# Patient Record
Sex: Female | Born: 1975 | Race: Black or African American | Hispanic: No | Marital: Married | Smoking: Never smoker
Health system: Southern US, Community
[De-identification: ages and names within clinical notes are randomized; demographics above are authoritative.]

## PROBLEM LIST (undated history)

## (undated) DIAGNOSIS — E669 Obesity, unspecified: Secondary | ICD-10-CM

## (undated) DIAGNOSIS — I10 Essential (primary) hypertension: Secondary | ICD-10-CM

## (undated) DIAGNOSIS — D219 Benign neoplasm of connective and other soft tissue, unspecified: Secondary | ICD-10-CM

## (undated) HISTORY — PX: OOPHORECTOMY: SHX86

## (undated) HISTORY — PX: MYOMECTOMY: SHX85

---

## 2008-02-22 HISTORY — PX: GASTRIC BYPASS: SHX52

## 2010-02-25 ENCOUNTER — Emergency Department (HOSPITAL_COMMUNITY)
Admission: EM | Admit: 2010-02-25 | Discharge: 2010-02-26 | Payer: Self-pay | Source: Home / Self Care | Admitting: Emergency Medicine

## 2010-02-25 ENCOUNTER — Emergency Department (HOSPITAL_COMMUNITY)
Admission: EM | Admit: 2010-02-25 | Discharge: 2010-02-25 | Payer: Self-pay | Source: Home / Self Care | Admitting: Family Medicine

## 2010-02-25 LAB — CBC
HCT: 33.9 % — ABNORMAL LOW (ref 36.0–46.0)
Hemoglobin: 10.4 g/dL — ABNORMAL LOW (ref 12.0–15.0)
MCH: 25.3 pg — ABNORMAL LOW (ref 26.0–34.0)
MCHC: 30.7 g/dL (ref 30.0–36.0)
MCV: 82.5 fL (ref 78.0–100.0)
Platelets: 214 10*3/uL (ref 150–400)
RBC: 4.11 MIL/uL (ref 3.87–5.11)
RDW: 14 % (ref 11.5–15.5)
WBC: 3.9 10*3/uL — ABNORMAL LOW (ref 4.0–10.5)

## 2010-02-25 LAB — BASIC METABOLIC PANEL
BUN: 7 mg/dL (ref 6–23)
CO2: 26 mEq/L (ref 19–32)
Calcium: 8.7 mg/dL (ref 8.4–10.5)
Chloride: 101 mEq/L (ref 96–112)
Creatinine, Ser: 1.09 mg/dL (ref 0.4–1.2)
GFR calc Af Amer: >60 mL/min (ref >60)
GFR calc non Af Amer: 57 mL/min — ABNORMAL LOW (ref >60)
Glucose, Bld: 109 mg/dL — ABNORMAL HIGH (ref 70–99)
Potassium: 3.8 mEq/L (ref 3.5–5.1)
Sodium: 135 mEq/L (ref 135–145)

## 2010-02-25 LAB — DIFFERENTIAL
Basophils Absolute: 0 10*3/uL (ref 0.0–0.1)
Basophils Relative: 0 % (ref 0–1)
Eosinophils Absolute: 0 10*3/uL (ref 0.0–0.7)
Eosinophils Relative: 0 % (ref 0–5)
Lymphocytes Relative: 17 % (ref 12–46)
Lymphs Abs: 0.7 10*3/uL (ref 0.7–4.0)
Monocytes Absolute: 0.2 10*3/uL (ref 0.1–1.0)
Monocytes Relative: 4 % (ref 3–12)
Neutro Abs: 3.1 10*3/uL (ref 1.7–7.7)
Neutrophils Relative %: 78 % — ABNORMAL HIGH (ref 43–77)

## 2010-02-25 LAB — POCT RAPID STREP A (OFFICE): Streptococcus, Group A Screen (Direct): NEGATIVE

## 2010-02-26 LAB — URINALYSIS, ROUTINE W REFLEX MICROSCOPIC
Bilirubin Urine: NEGATIVE
Ketones, ur: 15 mg/dL — AB
Leukocytes, UA: NEGATIVE
Nitrite: NEGATIVE
Protein, ur: NEGATIVE mg/dL
Specific Gravity, Urine: 1.02 (ref 1.005–1.030)
Urine Glucose, Fasting: NEGATIVE mg/dL
Urobilinogen, UA: 0.2 mg/dL (ref 0.0–1.0)
pH: 6 (ref 5.0–8.0)

## 2010-02-26 LAB — URINE MICROSCOPIC-ADD ON

## 2011-08-12 ENCOUNTER — Ambulatory Visit (INDEPENDENT_AMBULATORY_CARE_PROVIDER_SITE_OTHER): Payer: 59 | Admitting: Family Medicine

## 2011-08-12 VITALS — BP 149/94 | HR 78 | Temp 98.4°F | Resp 16 | Ht 64.75 in | Wt 236.0 lb

## 2011-08-12 DIAGNOSIS — L259 Unspecified contact dermatitis, unspecified cause: Secondary | ICD-10-CM

## 2011-08-12 DIAGNOSIS — L237 Allergic contact dermatitis due to plants, except food: Secondary | ICD-10-CM

## 2011-08-12 DIAGNOSIS — L255 Unspecified contact dermatitis due to plants, except food: Secondary | ICD-10-CM

## 2011-08-12 MED ORDER — PREDNISONE 20 MG PO TABS: ORAL_TABLET | ORAL | Status: DC

## 2011-08-12 NOTE — Progress Notes (Signed)
      Patient Name: Heather Henson Date of Birth: Mar 18, 1975 Medical Record Number: [id number] Gender: female Date of Encounter: 08/12/2011  History of Present Illness:  Heather Henson is a 36 y.o. very pleasant female patient who presents with the following:  She noted onset of a blistery rash on her hands yesterday- it then spread to her chest and arms.  There is a little bit on her trunk as well.  She has not been working outdoors, no new foods or products.  She has not had this in the past.  She is usually healthy. No other symptoms that she has noted- no fever, chills, ST, etc.   She has tried benadryl but it has not helped LMP was 2 weeks ago The rash is itchy but also tender/ painful  There is no problem list on file for this patient.  No past medical history on file. No past surgical history on file. History  Substance Use Topics  . Smoking status: Never Smoker   . Smokeless tobacco: Not on file  . Alcohol Use: Not on file   No family history on file. Allergies  Allergen Reactions  . Morphine And Related   . Tetracyclines & Related     Medication list has been reviewed and updated.  Prior to Admission medications   Medication Sig Start Date End Date Taking? Authorizing Provider  Multiple Vitamins-Minerals (MULTIVITAMIN WITH MINERALS) tablet Take 1 tablet by mouth daily.   Yes Historical Provider, MD    Review of Systems:  As per HPI- otherwise negative.   Physical Examination: Filed Vitals:   08/12/11 0858  BP: 149/94  Pulse: 78  Temp: 98.4 F (36.9 C)  Resp: 16   Filed Vitals:   08/12/11 0858  Height: 5' 4.75" (1.645 m)  Weight: 236 lb (107.049 kg)   Body mass index is 39.58 kg/(m^2). Ideal Body Weight: Weight in (lb) to have BMI = 25: 148.8   GEN: WDWN, NAD, Non-toxic, A & O x 3, obese HEENT: Atraumatic, Normocephalic. Neck supple. No masses, No LAD.  PEERL, no oral lesions, oropharynx wnl Ears and Nose: No external deformity. CV: RRR, No  M/G/R. No JVD. No thrill. No extra heart sounds. PULM: CTA B, no wheezes, crackles, rhonchi. No retractions. No resp. distress. No accessory muscle use. EXTR: No c/c/e NEURO Normal gait.  PSYCH: Normally interactive. Conversant. Not depressed or anxious appearing.  Calm demeanor.  There is a clustered vesicular rash typical of rhus dermatitis on her fingers (both hands) and some on her neck and left abdomen.   Assessment and Plan: 1. Contact dermatitis  predniSONE (DELTASONE) 20 MG tablet  2. Poison ivy  predniSONE (DELTASONE) 20 MG tablet  contact/ rhus dermatitis per appearance, but pain with rash is less typical.  Close follow- up if not better with treatment.  Patient (or parent if minor) instructed to return to clinic or call if not better in 2 day(s).   Abbe Amsterdam, MD

## 2011-09-22 ENCOUNTER — Other Ambulatory Visit (HOSPITAL_COMMUNITY): Payer: Self-pay | Admitting: Obstetrics and Gynecology

## 2011-09-22 DIAGNOSIS — N979 Female infertility, unspecified: Secondary | ICD-10-CM

## 2011-09-27 ENCOUNTER — Ambulatory Visit (HOSPITAL_COMMUNITY)
Admission: RE | Admit: 2011-09-27 | Discharge: 2011-09-27 | Disposition: A | Discharge status: Home / Self Care | Payer: 59 | Source: Ambulatory Visit | Attending: Obstetrics and Gynecology | Admitting: Obstetrics and Gynecology

## 2011-09-27 DIAGNOSIS — N979 Female infertility, unspecified: Secondary | ICD-10-CM | POA: Insufficient documentation

## 2011-09-27 MED ORDER — IOHEXOL 300 MG/ML  SOLN: 6.0000 mL | Freq: Once | INTRAMUSCULAR | Status: AC | PRN

## 2011-10-09 ENCOUNTER — Encounter (HOSPITAL_COMMUNITY): Payer: Self-pay | Admitting: *Deleted

## 2011-10-09 ENCOUNTER — Emergency Department (HOSPITAL_COMMUNITY): Payer: 59

## 2011-10-09 ENCOUNTER — Emergency Department (HOSPITAL_COMMUNITY)
Admission: EM | Admit: 2011-10-09 | Discharge: 2011-10-09 | Disposition: A | Discharge status: Home / Self Care | Payer: 59 | Attending: Emergency Medicine | Admitting: Emergency Medicine

## 2011-10-09 DIAGNOSIS — K2970 Gastritis, unspecified, without bleeding: Secondary | ICD-10-CM

## 2011-10-09 HISTORY — DX: Obesity, unspecified: E66.9

## 2011-10-09 LAB — COMPREHENSIVE METABOLIC PANEL
ALT: 16 U/L (ref 0–35)
AST: 29 U/L (ref 0–37)
Albumin: 3.5 g/dL (ref 3.5–5.2)
Alkaline Phosphatase: 63 U/L (ref 39–117)
BUN: 14 mg/dL (ref 6–23)
CO2: 23 mEq/L (ref 19–32)
Calcium: 8.7 mg/dL (ref 8.4–10.5)
Chloride: 105 mEq/L (ref 96–112)
Creatinine, Ser: 0.69 mg/dL (ref 0.50–1.10)
GFR calc Af Amer: >90 mL/min (ref >90)
GFR calc non Af Amer: >90 mL/min (ref >90)
Glucose, Bld: 129 mg/dL — ABNORMAL HIGH (ref 70–99)
Potassium: 4 mEq/L (ref 3.5–5.1)
Sodium: 137 mEq/L (ref 135–145)
Total Bilirubin: 0.2 mg/dL — ABNORMAL LOW (ref 0.3–1.2)
Total Protein: 6.9 g/dL (ref 6.0–8.3)

## 2011-10-09 LAB — LIPASE, BLOOD: Lipase: 20 U/L (ref 11–59)

## 2011-10-09 LAB — CBC WITH DIFFERENTIAL/PLATELET
Basophils Absolute: 0 10*3/uL (ref 0.0–0.1)
Basophils Relative: 0 % (ref 0–1)
Eosinophils Absolute: 0 10*3/uL (ref 0.0–0.7)
Eosinophils Relative: 0 % (ref 0–5)
HCT: 36.6 % (ref 36.0–46.0)
Hemoglobin: 11.1 g/dL — ABNORMAL LOW (ref 12.0–15.0)
Lymphocytes Relative: 12 % (ref 12–46)
Lymphs Abs: 1 10*3/uL (ref 0.7–4.0)
MCH: 24.2 pg — ABNORMAL LOW (ref 26.0–34.0)
MCHC: 30.3 g/dL (ref 30.0–36.0)
MCV: 79.9 fL (ref 78.0–100.0)
Monocytes Absolute: 0.3 10*3/uL (ref 0.1–1.0)
Monocytes Relative: 4 % (ref 3–12)
Neutro Abs: 6.4 10*3/uL (ref 1.7–7.7)
Neutrophils Relative %: 83 % — ABNORMAL HIGH (ref 43–77)
Platelets: 271 10*3/uL (ref 150–400)
RBC: 4.58 MIL/uL (ref 3.87–5.11)
RDW: 15 % (ref 11.5–15.5)
WBC: 7.7 10*3/uL (ref 4.0–10.5)

## 2011-10-09 MED ORDER — FAMOTIDINE 20 MG PO TABS
40.0000 mg | ORAL_TABLET | Freq: Once | ORAL | Status: AC
  Administered 2011-10-09: 40 mg via ORAL
  Filled 2011-10-09: qty 2

## 2011-10-09 MED ORDER — PANTOPRAZOLE SODIUM 20 MG PO TBEC: 20.0000 mg | DELAYED_RELEASE_TABLET | Freq: Every day | ORAL | Status: DC

## 2011-10-09 MED ORDER — HYDROMORPHONE HCL PF 1 MG/ML IJ SOLN
1.0000 mg | Freq: Once | INTRAMUSCULAR | Status: AC
  Administered 2011-10-09: 1 mg via INTRAVENOUS
  Filled 2011-10-09: qty 1

## 2011-10-09 MED ORDER — HYDROMORPHONE HCL PF 1 MG/ML IJ SOLN: 1.0000 mg | Freq: Once | INTRAMUSCULAR | Status: DC

## 2011-10-09 MED ORDER — LORAZEPAM 2 MG/ML IJ SOLN
1.0000 mg | Freq: Once | INTRAMUSCULAR | Status: AC
  Administered 2011-10-09: 1 mg via INTRAVENOUS
  Filled 2011-10-09: qty 1

## 2011-10-09 MED ORDER — SUCRALFATE 1 G PO TABS: 1.0000 g | ORAL_TABLET | Freq: Four times a day (QID) | ORAL | Status: DC

## 2011-10-09 MED ORDER — LORAZEPAM 2 MG/ML IJ SOLN: 1.0000 mg | Freq: Once | INTRAMUSCULAR | Status: DC

## 2011-10-09 MED ORDER — SODIUM CHLORIDE 0.9 % IV BOLUS (SEPSIS)
1000.0000 mL | Freq: Once | INTRAVENOUS | Status: AC
  Administered 2011-10-09: 1000 mL via INTRAVENOUS

## 2011-10-09 MED ORDER — SODIUM CHLORIDE 0.9 % IV SOLN
INTRAVENOUS | Status: DC
  Administered 2011-10-09: 11:00:00 via INTRAVENOUS

## 2011-10-09 MED ORDER — GI COCKTAIL ~~LOC~~
30.0000 mL | Freq: Once | ORAL | Status: AC
  Administered 2011-10-09: 30 mL via ORAL
  Filled 2011-10-09: qty 30

## 2011-10-09 MED ORDER — ONDANSETRON HCL 4 MG/2ML IJ SOLN
4.0000 mg | Freq: Once | INTRAMUSCULAR | Status: AC
  Administered 2011-10-09: 4 mg via INTRAVENOUS
  Filled 2011-10-09: qty 2

## 2011-10-09 MED ORDER — DIPHENHYDRAMINE HCL 50 MG/ML IJ SOLN
25.0000 mg | Freq: Once | INTRAMUSCULAR | Status: AC
  Administered 2011-10-09: 25 mg via INTRAVENOUS
  Filled 2011-10-09: qty 1

## 2011-10-09 NOTE — ED Notes (Signed)
NAD noted at time of d/c home 

## 2011-10-09 NOTE — ED Provider Notes (Signed)
History     CSN: [id number]  Arrival date & time 10/09/11  0808   First MD Initiated Contact with Patient 10/09/11 [id number]      No chief complaint on file.   (Consider location/radiation/quality/duration/timing/severity/associated sxs/prior treatment) The history is provided by the patient.   patient here with abdominal discomfort after drinking approximately 2 ounces of hydrogen peroxide. She has had vomiting and diarrhea since then. Abdominal pain is cramping and diffuse in nature. Nothing makes it better or worse. States that she drank the liquid by mistake. Denies any fever. No other complaints of  No past medical history on file.  No past surgical history on file.  No family history on file.  History  Substance Use Topics  . Smoking status: Never Smoker   . Smokeless tobacco: Not on file  . Alcohol Use: Not on file    OB History    Grav Para Term Preterm Abortions TAB SAB Ect Mult Living                  Review of Systems  All other systems reviewed and are negative.    Allergies  Morphine and related and Tetracyclines & related  Home Medications   Current Outpatient Rx  Name Route Sig Dispense Refill  . MULTI-VITAMIN/MINERALS PO TABS Oral Take 1 tablet by mouth daily.    Marland Kitchen PREDNISONE 20 MG PO TABS  Take 2 pills for 5 days, then 1 pill for 5 days 15 tablet 0    LMP 09/20/2011  Physical Exam  Nursing note and vitals reviewed. Constitutional: She is oriented to person, place, and time. She appears well-developed and well-nourished.  Non-toxic appearance. No distress.  HENT:  Head: Normocephalic and atraumatic.  Eyes: Conjunctivae, EOM and lids are normal. Pupils are equal, round, and reactive to light.  Neck: Normal range of motion. Neck supple. No tracheal deviation present. No mass present.  Cardiovascular: Normal rate, regular rhythm and normal heart sounds.  Exam reveals no gallop.   No murmur heard. Pulmonary/Chest: Effort normal and breath sounds  normal. No stridor. No respiratory distress. She has no decreased breath sounds. She has no wheezes. She has no rhonchi. She has no rales.  Abdominal: Soft. Normal appearance and bowel sounds are normal. She exhibits no distension. There is generalized tenderness. There is no rigidity, no rebound, no guarding and no CVA tenderness.  Musculoskeletal: Normal range of motion. She exhibits no edema and no tenderness.  Neurological: She is alert and oriented to person, place, and time. She has normal strength. No cranial nerve deficit or sensory deficit. GCS eye subscore is 4. GCS verbal subscore is 5. GCS motor subscore is 6.  Skin: Skin is warm and dry. No abrasion and no rash noted.  Psychiatric: She has a normal mood and affect. Her speech is normal and behavior is normal.    ED Course  Procedures (including critical care time)   Labs Reviewed  CBC WITH DIFFERENTIAL  COMPREHENSIVE METABOLIC PANEL  LIPASE, BLOOD   No results found.   No diagnosis found.    MDM  Patient given medications here for suspected gastritis from taking peroxide. She does so but at this time. We'll prescribe Carafate and Protonix and discharged home        Toy Baker, MD 10/09/11 1158

## 2011-10-09 NOTE — ED Notes (Signed)
Pt reports waking up this am and accidentally taking gulp out of hydrogen peroxide bottle instead of her water, estimates 2oz, now having n/v.

## 2011-10-10 ENCOUNTER — Encounter (HOSPITAL_COMMUNITY): Payer: Self-pay | Admitting: *Deleted

## 2011-10-10 ENCOUNTER — Emergency Department (HOSPITAL_COMMUNITY): Payer: 59

## 2011-10-10 ENCOUNTER — Inpatient Hospital Stay (HOSPITAL_COMMUNITY)
Admission: EM | Admit: 2011-10-10 | Discharge: 2011-10-14 | DRG: 918 | Disposition: A | Discharge status: Home / Self Care | Payer: 59 | Attending: Internal Medicine | Admitting: Internal Medicine

## 2011-10-10 DIAGNOSIS — R112 Nausea with vomiting, unspecified: Secondary | ICD-10-CM

## 2011-10-10 DIAGNOSIS — T496X1A Poisoning by otorhinolaryngological drugs and preparations, accidental (unintentional), initial encounter: Principal | ICD-10-CM | POA: Diagnosis present

## 2011-10-10 DIAGNOSIS — T495X1A Poisoning by ophthalmological drugs and preparations, accidental (unintentional), initial encounter: Secondary | ICD-10-CM | POA: Diagnosis present

## 2011-10-10 DIAGNOSIS — Y92009 Unspecified place in unspecified non-institutional (private) residence as the place of occurrence of the external cause: Secondary | ICD-10-CM

## 2011-10-10 DIAGNOSIS — R109 Unspecified abdominal pain: Secondary | ICD-10-CM

## 2011-10-10 DIAGNOSIS — K529 Noninfective gastroenteritis and colitis, unspecified: Secondary | ICD-10-CM

## 2011-10-10 DIAGNOSIS — E876 Hypokalemia: Secondary | ICD-10-CM

## 2011-10-10 DIAGNOSIS — Z9884 Bariatric surgery status: Secondary | ICD-10-CM

## 2011-10-10 DIAGNOSIS — I10 Essential (primary) hypertension: Secondary | ICD-10-CM

## 2011-10-10 DIAGNOSIS — K920 Hematemesis: Secondary | ICD-10-CM

## 2011-10-10 HISTORY — DX: Essential (primary) hypertension: I10

## 2011-10-10 HISTORY — DX: Benign neoplasm of connective and other soft tissue, unspecified: D21.9

## 2011-10-10 LAB — HEPATIC FUNCTION PANEL
Albumin: 3.4 g/dL — ABNORMAL LOW (ref 3.5–5.2)
Total Protein: 7 g/dL (ref 6.0–8.3)

## 2011-10-10 LAB — CBC WITH DIFFERENTIAL/PLATELET
Basophils Absolute: 0 10*3/uL (ref 0.0–0.1)
Basophils Relative: 0 % (ref 0–1)
Eosinophils Absolute: 0.1 10*3/uL (ref 0.0–0.7)
Eosinophils Relative: 0 % (ref 0–5)
HCT: 34.2 % — ABNORMAL LOW (ref 36.0–46.0)
Hemoglobin: 10.9 g/dL — ABNORMAL LOW (ref 12.0–15.0)
Lymphocytes Relative: 10 % — ABNORMAL LOW (ref 12–46)
Lymphs Abs: 1.8 10*3/uL (ref 0.7–4.0)
MCH: 24.9 pg — ABNORMAL LOW (ref 26.0–34.0)
MCHC: 31.9 g/dL (ref 30.0–36.0)
MCV: 78.3 fL (ref 78.0–100.0)
Monocytes Absolute: 0.8 10*3/uL (ref 0.1–1.0)
Monocytes Relative: 4 % (ref 3–12)
Neutro Abs: 16.3 10*3/uL — ABNORMAL HIGH (ref 1.7–7.7)
Neutrophils Relative %: 86 % — ABNORMAL HIGH (ref 43–77)
Platelets: 277 10*3/uL (ref 150–400)
RBC: 4.37 MIL/uL (ref 3.87–5.11)
RDW: 14.7 % (ref 11.5–15.5)
WBC: 19 10*3/uL — ABNORMAL HIGH (ref 4.0–10.5)

## 2011-10-10 LAB — BASIC METABOLIC PANEL
BUN: 6 mg/dL (ref 6–23)
CO2: 26 mEq/L (ref 19–32)
Calcium: 9 mg/dL (ref 8.4–10.5)
Chloride: 100 mEq/L (ref 96–112)
Creatinine, Ser: 0.71 mg/dL (ref 0.50–1.10)
GFR calc Af Amer: >90 mL/min (ref >90)
GFR calc non Af Amer: >90 mL/min (ref >90)
Glucose, Bld: 112 mg/dL — ABNORMAL HIGH (ref 70–99)
Potassium: 3.2 mEq/L — ABNORMAL LOW (ref 3.5–5.1)
Sodium: 134 mEq/L — ABNORMAL LOW (ref 135–145)

## 2011-10-10 LAB — OCCULT BLOOD, POC DEVICE: Fecal Occult Bld: NEGATIVE

## 2011-10-10 LAB — URINALYSIS, ROUTINE W REFLEX MICROSCOPIC
Bilirubin Urine: NEGATIVE
Glucose, UA: NEGATIVE mg/dL
Hgb urine dipstick: NEGATIVE
Ketones, ur: 15 mg/dL — AB
Leukocytes, UA: NEGATIVE
Nitrite: NEGATIVE
Protein, ur: NEGATIVE mg/dL
Specific Gravity, Urine: 1.019 (ref 1.005–1.030)
Urobilinogen, UA: 1 mg/dL (ref 0.0–1.0)
pH: 5.5 (ref 5.0–8.0)

## 2011-10-10 LAB — POCT PREGNANCY, URINE: Preg Test, Ur: NEGATIVE

## 2011-10-10 LAB — LIPASE, BLOOD: Lipase: 17 U/L (ref 11–59)

## 2011-10-10 MED ORDER — ONDANSETRON HCL 4 MG/2ML IJ SOLN
4.0000 mg | Freq: Once | INTRAMUSCULAR | Status: AC
  Administered 2011-10-10: 4 mg via INTRAVENOUS
  Filled 2011-10-10: qty 2

## 2011-10-10 MED ORDER — HYDROMORPHONE HCL PF 1 MG/ML IJ SOLN
1.0000 mg | Freq: Once | INTRAMUSCULAR | Status: AC
  Administered 2011-10-10: 1 mg via INTRAVENOUS
  Filled 2011-10-10: qty 1

## 2011-10-10 MED ORDER — POTASSIUM CHLORIDE CRYS ER 20 MEQ PO TBCR
20.0000 meq | EXTENDED_RELEASE_TABLET | Freq: Once | ORAL | Status: AC
  Administered 2011-10-10: 20 meq via ORAL
  Filled 2011-10-10: qty 1

## 2011-10-10 MED ORDER — DICYCLOMINE HCL 10 MG/ML IM SOLN
20.0000 mg | Freq: Once | INTRAMUSCULAR | Status: AC
  Administered 2011-10-10: 20 mg via INTRAMUSCULAR
  Filled 2011-10-10: qty 2

## 2011-10-10 MED ORDER — IOHEXOL 300 MG/ML  SOLN: 40.0000 mL | Freq: Once | INTRAMUSCULAR | Status: DC | PRN

## 2011-10-10 MED ORDER — SODIUM CHLORIDE 0.9 % IV BOLUS (SEPSIS)
1000.0000 mL | Freq: Once | INTRAVENOUS | Status: AC
  Administered 2011-10-10: 1000 mL via INTRAVENOUS

## 2011-10-10 MED ORDER — PANTOPRAZOLE SODIUM 40 MG IV SOLR
40.0000 mg | Freq: Once | INTRAVENOUS | Status: AC
  Administered 2011-10-10: 40 mg via INTRAVENOUS
  Filled 2011-10-10: qty 40

## 2011-10-10 MED ORDER — IOHEXOL 300 MG/ML  SOLN
100.0000 mL | Freq: Once | INTRAMUSCULAR | Status: AC | PRN
  Administered 2011-10-10: 100 mL via INTRAVENOUS

## 2011-10-10 MED ORDER — IOHEXOL 300 MG/ML  SOLN
20.0000 mL | INTRAMUSCULAR | Status: AC
  Administered 2011-10-10: 20 mL via ORAL

## 2011-10-10 NOTE — ED Notes (Signed)
Pt. Reports accidentally drinking hydrogen peroxide yesterday. Was seen here yesterday for it. States the medicine they gave her did not work. Reports abdominal pain 10/10, and vomiting blood. States "It has just gotten worse".

## 2011-10-10 NOTE — ED Provider Notes (Signed)
9:57 PM Patient to move to CDU holding for CT abd/pelvis.  Sign out received from Marlon Pel, PA-C.  Pt with visit to ED yesterday after accidentally drinking hydrogen peroxide.  Presents today with worsening diffuse abdominal pain, N/V.  WBC now 19. Also with hematemesis.  Pt taking protonix at home, prescribed yesterday.  Plan is for CT abd/pelvis.  If negative, pt to be d/c home with norco, zofran.    10:56 PM Patient reports continued 10/10 pain.  Nurse currently giving protonix and bentyl.  Pt actively drinking contrast for CT scan.  Pt is A&O, NAD, RRR, CTAB, abd NABS, soft, nondistended, diffusely tender, no guarding, no rebound.    11:48 PM Patient has returned from CT.  Reports she is still in pain but currently does not want any pain medication.  Denies nausea.    11:57 PM CT results discussed with Dr Lynelle Doctor, Marlon Pel, PA-C, and also with patient.  Dr Lynelle Doctor suggests calling general surgery to discuss results given patient's gastric bypass (2010).  Pt states pain is now 9/10, states it was "15/10" when she came in. Pt reexamined, abdomen is soft, nondistended, diffusely tender to palpation without any localization, no guarding, no rebound.    Marlon Pel, PA-C, to reassume care of patient at change of shift.    Results for orders placed during the hospital encounter of 10/10/11  URINALYSIS, ROUTINE W REFLEX MICROSCOPIC      Component Value Range   Color, Urine YELLOW  YELLOW   APPearance CLOUDY (*) CLEAR   Specific Gravity, Urine 1.019  1.005 - 1.030   pH 5.5  5.0 - 8.0   Glucose, UA NEGATIVE  NEGATIVE mg/dL   Hgb urine dipstick NEGATIVE  NEGATIVE   Bilirubin Urine NEGATIVE  NEGATIVE   Ketones, ur 15 (*) NEGATIVE mg/dL   Protein, ur NEGATIVE  NEGATIVE mg/dL   Urobilinogen, UA 1.0  0.0 - 1.0 mg/dL   Nitrite NEGATIVE  NEGATIVE   Leukocytes, UA NEGATIVE  NEGATIVE  BASIC METABOLIC PANEL      Component Value Range   Sodium 134 (*) 135 - 145 mEq/L   Potassium 3.2 (*) 3.5 -  5.1 mEq/L   Chloride 100  96 - 112 mEq/L   CO2 26  19 - 32 mEq/L   Glucose, Bld 112 (*) 70 - 99 mg/dL   BUN 6  6 - 23 mg/dL   Creatinine, Ser 1.61  0.50 - 1.10 mg/dL   Calcium 9.0  8.4 - 09.6 mg/dL   GFR calc non Af Amer >90  >90 mL/min   GFR calc Af Amer >90  >90 mL/min  CBC WITH DIFFERENTIAL      Component Value Range   WBC 19.0 (*) 4.0 - 10.5 K/uL   RBC 4.37  3.87 - 5.11 MIL/uL   Hemoglobin 10.9 (*) 12.0 - 15.0 g/dL   HCT 04.5 (*) 40.9 - 81.1 %   MCV 78.3  78.0 - 100.0 fL   MCH 24.9 (*) 26.0 - 34.0 pg   MCHC 31.9  30.0 - 36.0 g/dL   RDW 91.4  78.2 - 95.6 %   Platelets 277  150 - 400 K/uL   Neutrophils Relative 86 (*) 43 - 77 %   Neutro Abs 16.3 (*) 1.7 - 7.7 K/uL   Lymphocytes Relative 10 (*) 12 - 46 %   Lymphs Abs 1.8  0.7 - 4.0 K/uL   Monocytes Relative 4  3 - 12 %   Monocytes Absolute 0.8  0.1 -  1.0 K/uL   Eosinophils Relative 0  0 - 5 %   Eosinophils Absolute 0.1  0.0 - 0.7 K/uL   Basophils Relative 0  0 - 1 %   Basophils Absolute 0.0  0.0 - 0.1 K/uL  HEPATIC FUNCTION PANEL      Component Value Range   Total Protein 7.0  6.0 - 8.3 g/dL   Albumin 3.4 (*) 3.5 - 5.2 g/dL   AST 17  0 - 37 U/L   ALT 13  0 - 35 U/L   Alkaline Phosphatase 66  39 - 117 U/L   Total Bilirubin 0.3  0.3 - 1.2 mg/dL   Bilirubin, Direct <1.4  0.0 - 0.3 mg/dL   Indirect Bilirubin NOT CALCULATED  0.3 - 0.9 mg/dL  LIPASE, BLOOD      Component Value Range   Lipase 17  11 - 59 U/L  POCT PREGNANCY, URINE      Component Value Range   Preg Test, Ur NEGATIVE  NEGATIVE  OCCULT BLOOD, POC DEVICE      Component Value Range   Fecal Occult Bld NEGATIVE     Ct Abdomen Pelvis W Contrast  10/10/2011  *RADIOLOGY REPORT*  Clinical Data: Mid and lower abdominal pain.  Nausea and vomiting.  CT ABDOMEN AND PELVIS WITH CONTRAST  Technique:  Multidetector CT imaging of the abdomen and pelvis was performed following the standard protocol during bolus administration of intravenous contrast.  Contrast:  OMNIPAQUE IOHEXOL 300 MG/ML  SOLN  Comparison: None.  Findings: The lung bases are clear.  The liver, spleen, gallbladder, pancreas, adrenal glands, kidneys, and abdominal aorta are unremarkable.  Mild prominence of celiac axis and retroperitoneal lymph nodes without significant pathological enlargement suggesting reactive nodes.  There are postoperative changes consistent with gastric bypass surgery.  The jejunal roux loop and left lower quadrant jejunum distal to the anastomoses demonstrate diffuse wall thickening.  This could represent enteritis or ischemia.  No pneumatosis or portal venous gas.  No mesenteric infiltration.  No small bowel distension.  The native stomach is decompressed.  No colonic distension or wall thickening.  No free air or free fluid in the abdomen.  Pelvis:  The uterus is enlarged, measuring 9.9 x 9.3 by 14.9 cm. Changes suggest fibroid uterus.  Small calcifications centrally. The adnexal structures are not enlarged.  Small amount of free fluid in the pelvis may be physiologic.  Bladder wall is not thickened.  No loculated fluid collections.  The appendix is normal.  No evidence of diverticulitis.  Mild degenerative changes in the lumbar spine.  Scarring in the anterior abdominal wall is likely old postsurgical change.  IMPRESSION: Postoperative changes of prior gastric bypass with regional thickening of the jejunal roux loop and distal jejunum most likely representing enteritis.  Focal ischemia not entirely excluded. Mesenteric lymph nodes are prominent but not pathologically enlarged, likely representing reactive nodes.  Diffuse enlargement of the uterus consistent with fibroid uterus.   Original Report Authenticated By: Marlon Pel, M.D.     Dg Abd Acute W/chest  10/09/2011  *RADIOLOGY REPORT*  Clinical Data: Mid abdominal and back pain for 6 hours with nausea and vomiting.  ACUTE ABDOMEN SERIES (ABDOMEN 2 VIEW & CHEST 1 VIEW)  Comparison: Plain film chest 02/26/2011   Findings: Frontal view of the chest demonstrates midline trachea. Normal heart size and mediastinal contours. No pleural effusion or pneumothorax.  Clear lungs.  Abominal films demonstrate no free intraperitoneal air or significant air fluid levels on upright  positioning.  Surgical sutures in the left upper quadrant suspected.  No significant bowel dilatation on supine imaging.  Phleboliths in the pelvis.  Minimal distal gas. No abnormal abdominal calcifications.   No appendicolith.  IMPRESSION: No acute findings.  Original Report Authenticated By: Consuello Bossier, M.D.      [place], [place] 10/11/11 0000

## 2011-10-10 NOTE — ED Provider Notes (Signed)
History     CSN: [id number]  Arrival date & time 10/10/11  1943   First MD Initiated Contact with Patient 10/10/11 2026      Chief Complaint  Patient presents with  . Abdominal Pain    (Consider location/radiation/quality/duration/timing/severity/associated sxs/prior treatment) HPI  Patient presents to the ER with complaints of abdominal pain, nausea, hematemesis that started yesterday. She has a history of gastric bypass surgery in 2010. Yesterday she accidentally drank hydrogen peroxide, she came to the ER, was evaluated anad diagnosed with gastritis. Since then her pain has become very severe. It has not wained at all. She continues to vomit, with blood. She had one episode of diarrhea, denies fever. Says the pain is diffuse and can not be localized. VSS   Past Medical History  Diagnosis Date  . Obesity     No past surgical history on file.  No family history on file.  History  Substance Use Topics  . Smoking status: Never Smoker   . Smokeless tobacco: Not on file  . Alcohol Use: No    OB History    Grav Para Term Preterm Abortions TAB SAB Ect Mult Living                  Review of Systems   HEENT: denies blurry vision or change in hearing PULMONARY: Denies difficulty breathing and SOB CARDIAC: denies chest pain or heart palpitations MUSCULOSKELETAL:  denies being unable to ambulate ABDOMEN AL: + abdominal pain GU: denies loss of bowel or urinary control NEURO: denies numbness and tingling in extremities SKIN: no new rashes PSYCH: patient denies anxiety or depression. NECK: Pt denies having neck pain     Allergies  Morphine and related and Tetracyclines & related  Home Medications   Current Outpatient Rx  Name Route Sig Dispense Refill  . ACETAMINOPHEN 500 MG PO TABS Oral Take 1,000 mg by mouth every 6 (six) hours as needed. For pain    . MULTI-VITAMIN/MINERALS PO TABS Oral Take 1 tablet by mouth daily.    Marland Kitchen PANTOPRAZOLE SODIUM 20 MG PO TBEC Oral  Take 1 tablet (20 mg total) by mouth daily. 20 tablet 0  . SUCRALFATE 1 G PO TABS Oral Take 1 tablet (1 g total) by mouth 4 (four) times daily. 30 tablet 0    BP 162/97  Pulse 86  Temp 98.6 F (37 C) (Oral)  Resp 18  SpO2 99%  LMP 09/20/2011  Physical Exam  Nursing note and vitals reviewed. Constitutional: She appears well-developed and well-nourished. No distress.  HENT:  Head: Normocephalic and atraumatic.  Eyes: Pupils are equal, round, and reactive to light.  Neck: Normal range of motion. Neck supple.  Cardiovascular: Normal rate and regular rhythm.   Pulmonary/Chest: Effort normal.  Abdominal: Soft. She exhibits no distension and no mass. There is tenderness (diffuse moderate to severe tenderness, not worsened grreatly by palpation). There is guarding (mild guarding). There is no rebound.  Genitourinary: Rectum normal. Guaiac negative stool.  Neurological: She is alert.  Skin: Skin is warm and dry.    ED Course  Procedures (including critical care time)  Labs Reviewed  URINALYSIS, ROUTINE W REFLEX MICROSCOPIC - Abnormal; Notable for the following:    APPearance CLOUDY (*)     Ketones, ur 15 (*)     All other components within normal limits  BASIC METABOLIC PANEL - Abnormal; Notable for the following:    Sodium 134 (*)     Potassium 3.2 (*)  Glucose, Bld 112 (*)     All other components within normal limits  CBC WITH DIFFERENTIAL - Abnormal; Notable for the following:    WBC 19.0 (*)     Hemoglobin 10.9 (*)     HCT 34.2 (*)     MCH 24.9 (*)     Neutrophils Relative 86 (*)     Neutro Abs 16.3 (*)     Lymphocytes Relative 10 (*)     All other components within normal limits  HEPATIC FUNCTION PANEL - Abnormal; Notable for the following:    Albumin 3.4 (*)     All other components within normal limits  LIPASE, BLOOD  POCT PREGNANCY, URINE  OCCULT BLOOD, POC DEVICE  OCCULT BLOOD X 1 CARD TO LAB, STOOL   Ct Abdomen Pelvis W Contrast  10/10/2011  *RADIOLOGY  REPORT*  Clinical Data: Mid and lower abdominal pain.  Nausea and vomiting.  CT ABDOMEN AND PELVIS WITH CONTRAST  Technique:  Multidetector CT imaging of the abdomen and pelvis was performed following the standard protocol during bolus administration of intravenous contrast.  Contrast: OMNIPAQUE IOHEXOL 300 MG/ML  SOLN  Comparison: None.  Findings: The lung bases are clear.  The liver, spleen, gallbladder, pancreas, adrenal glands, kidneys, and abdominal aorta are unremarkable.  Mild prominence of celiac axis and retroperitoneal lymph nodes without significant pathological enlargement suggesting reactive nodes.  There are postoperative changes consistent with gastric bypass surgery.  The jejunal roux loop and left lower quadrant jejunum distal to the anastomoses demonstrate diffuse wall thickening.  This could represent enteritis or ischemia.  No pneumatosis or portal venous gas.  No mesenteric infiltration.  No small bowel distension.  The native stomach is decompressed.  No colonic distension or wall thickening.  No free air or free fluid in the abdomen.  Pelvis:  The uterus is enlarged, measuring 9.9 x 9.3 by 14.9 cm. Changes suggest fibroid uterus.  Small calcifications centrally. The adnexal structures are not enlarged.  Small amount of free fluid in the pelvis may be physiologic.  Bladder wall is not thickened.  No loculated fluid collections.  The appendix is normal.  No evidence of diverticulitis.  Mild degenerative changes in the lumbar spine.  Scarring in the anterior abdominal wall is likely old postsurgical change.  IMPRESSION: Postoperative changes of prior gastric bypass with regional thickening of the jejunal roux loop and distal jejunum most likely representing enteritis.  Focal ischemia not entirely excluded. Mesenteric lymph nodes are prominent but not pathologically enlarged, likely representing reactive nodes.  Diffuse enlargement of the uterus consistent with fibroid uterus.   Original  Report Authenticated By: Marlon Pel, M.D.    Dg Abd Acute W/chest  10/09/2011  *RADIOLOGY REPORT*  Clinical Data: Mid abdominal and back pain for 6 hours with nausea and vomiting.  ACUTE ABDOMEN SERIES (ABDOMEN 2 VIEW & CHEST 1 VIEW)  Comparison: Plain film chest 02/26/2011  Findings: Frontal view of the chest demonstrates midline trachea. Normal heart size and mediastinal contours. No pleural effusion or pneumothorax.  Clear lungs.  Abominal films demonstrate no free intraperitoneal air or significant air fluid levels on upright positioning.  Surgical sutures in the left upper quadrant suspected.  No significant bowel dilatation on supine imaging.  Phleboliths in the pelvis.  Minimal distal gas. No abnormal abdominal calcifications.   No appendicolith.  IMPRESSION: No acute findings.  Original Report Authenticated By: Consuello Bossier, M.D.     1. Abdominal pain   2. Nausea and vomiting  3. Enteritis       MDM  Patient given pain medication and work-up initiated. Pts WBC significantly elevated from yesterday. Will order CT abd/pelv with contrast to find pathology.  12:21 AM- patients CT scan shows some abnormal findings of wall thickening about the roux loops. I have spoken with General Surgery, Dr. Lindie Spruce. He has reviewed his CT scan and has r/o obstruction. He informs me that their isnt anything surgical and that the white count is most likely coming from the white count. He also says that, although he hasnt seen it before, it is possible that the paroxide caused the thickening of the bowel as the part that is inflamed comes right off of the stomach.   The patients pain has been difficult to control in the ED.  Patient admitted to [facility], TEAM 10, Dr. Pearson Grippe, inpatient        Dorthula Matas, [place] 10/11/11 [phone]

## 2011-10-10 NOTE — ED Notes (Signed)
Pt finished drinking contrast CT made aware.  Husband at bedside.

## 2011-10-10 NOTE — ED Notes (Signed)
Pt presents with abdominal pain, nausea, and vomiting onset yesterday.  Pt accidentally drank hydrogen prodoxide.  Reports seen in ED yesterday.

## 2011-10-10 NOTE — ED Notes (Signed)
Pt to CT via stretcher

## 2011-10-10 NOTE — ED Notes (Signed)
Pt. Rocking back and forth and moaning in pain.

## 2011-10-11 ENCOUNTER — Encounter (HOSPITAL_COMMUNITY): Payer: Self-pay | Admitting: Internal Medicine

## 2011-10-11 DIAGNOSIS — R109 Unspecified abdominal pain: Secondary | ICD-10-CM

## 2011-10-11 DIAGNOSIS — K5289 Other specified noninfective gastroenteritis and colitis: Secondary | ICD-10-CM

## 2011-10-11 DIAGNOSIS — E876 Hypokalemia: Secondary | ICD-10-CM | POA: Diagnosis present

## 2011-10-11 DIAGNOSIS — R112 Nausea with vomiting, unspecified: Secondary | ICD-10-CM

## 2011-10-11 LAB — DIFFERENTIAL
Basophils Relative: 0 % (ref 0–1)
Monocytes Absolute: 0.8 10*3/uL (ref 0.1–1.0)
Monocytes Relative: 4 % (ref 3–12)
Neutro Abs: 15 10*3/uL — ABNORMAL HIGH (ref 1.7–7.7)

## 2011-10-11 LAB — COMPREHENSIVE METABOLIC PANEL
ALT: 10 U/L (ref 0–35)
AST: 15 U/L (ref 0–37)
Calcium: 8.2 mg/dL — ABNORMAL LOW (ref 8.4–10.5)
GFR calc Af Amer: >90 mL/min (ref >90)
Sodium: 136 mEq/L (ref 135–145)
Total Protein: 5.9 g/dL — ABNORMAL LOW (ref 6.0–8.3)

## 2011-10-11 LAB — CBC
HCT: 29.8 % — ABNORMAL LOW (ref 36.0–46.0)
Hemoglobin: 9.4 g/dL — ABNORMAL LOW (ref 12.0–15.0)
MCH: 25.2 pg — ABNORMAL LOW (ref 26.0–34.0)
MCHC: 31.5 g/dL (ref 30.0–36.0)

## 2011-10-11 MED ORDER — ONDANSETRON HCL 4 MG/2ML IJ SOLN
4.0000 mg | Freq: Four times a day (QID) | INTRAMUSCULAR | Status: DC | PRN
  Administered 2011-10-11: 4 mg via INTRAVENOUS
  Filled 2011-10-11: qty 2

## 2011-10-11 MED ORDER — SUCRALFATE 1 G PO TABS
1.0000 g | ORAL_TABLET | Freq: Four times a day (QID) | ORAL | Status: DC
  Administered 2011-10-11 – 2011-10-14 (×14): 1 g via ORAL
  Filled 2011-10-11 (×16): qty 1

## 2011-10-11 MED ORDER — ACETAMINOPHEN 500 MG PO TABS
1000.0000 mg | ORAL_TABLET | Freq: Four times a day (QID) | ORAL | Status: DC | PRN
  Administered 2011-10-11 – 2011-10-14 (×5): 1000 mg via ORAL
  Filled 2011-10-11 (×6): qty 2

## 2011-10-11 MED ORDER — DIPHENHYDRAMINE HCL 25 MG PO CAPS
50.0000 mg | ORAL_CAPSULE | Freq: Four times a day (QID) | ORAL | Status: DC | PRN
  Administered 2011-10-11 – 2011-10-14 (×4): 50 mg via ORAL
  Filled 2011-10-11: qty 1
  Filled 2011-10-11: qty 2
  Filled 2011-10-11: qty 1
  Filled 2011-10-11: qty 2
  Filled 2011-10-11: qty 1

## 2011-10-11 MED ORDER — DIPHENHYDRAMINE HCL 25 MG PO CAPS: 50.0000 mg | ORAL_CAPSULE | Freq: Four times a day (QID) | ORAL | Status: DC | PRN

## 2011-10-11 MED ORDER — HYDROMORPHONE HCL PF 1 MG/ML IJ SOLN
1.0000 mg | INTRAMUSCULAR | Status: DC | PRN
  Administered 2011-10-11 – 2011-10-14 (×10): 1 mg via INTRAVENOUS
  Filled 2011-10-11 (×10): qty 1

## 2011-10-11 MED ORDER — CHLORHEXIDINE GLUCONATE 0.12 % MT SOLN
15.0000 mL | Freq: Two times a day (BID) | OROMUCOSAL | Status: DC
  Administered 2011-10-11 – 2011-10-12 (×3): 15 mL via OROMUCOSAL
  Filled 2011-10-11 (×5): qty 15

## 2011-10-11 MED ORDER — BIOTENE DRY MOUTH MT LIQD
15.0000 mL | Freq: Two times a day (BID) | OROMUCOSAL | Status: DC
  Administered 2011-10-11 – 2011-10-14 (×4): 15 mL via OROMUCOSAL

## 2011-10-11 MED ORDER — SODIUM CHLORIDE 0.9 % IJ SOLN
3.0000 mL | Freq: Two times a day (BID) | INTRAMUSCULAR | Status: DC
  Administered 2011-10-11 – 2011-10-13 (×5): 3 mL via INTRAVENOUS

## 2011-10-11 MED ORDER — SODIUM CHLORIDE 0.9 % IV SOLN
INTRAVENOUS | Status: AC
  Administered 2011-10-11: 01:00:00 via INTRAVENOUS

## 2011-10-11 MED ORDER — HYDROMORPHONE HCL PF 1 MG/ML IJ SOLN
1.0000 mg | Freq: Once | INTRAMUSCULAR | Status: AC
  Administered 2011-10-11: 1 mg via INTRAVENOUS
  Filled 2011-10-11: qty 1

## 2011-10-11 MED ORDER — PANTOPRAZOLE SODIUM 20 MG PO TBEC
20.0000 mg | DELAYED_RELEASE_TABLET | Freq: Every day | ORAL | Status: DC
  Administered 2011-10-11 – 2011-10-14 (×4): 20 mg via ORAL
  Filled 2011-10-11 (×6): qty 1

## 2011-10-11 MED ORDER — HYDROMORPHONE HCL PF 1 MG/ML IJ SOLN
0.5000 mg | INTRAMUSCULAR | Status: DC | PRN
  Administered 2011-10-11 (×2): 0.5 mg via INTRAVENOUS
  Filled 2011-10-11 (×2): qty 1

## 2011-10-11 MED ORDER — KETOROLAC TROMETHAMINE 30 MG/ML IJ SOLN
30.0000 mg | Freq: Once | INTRAMUSCULAR | Status: AC
  Administered 2011-10-11: 30 mg via INTRAVENOUS
  Filled 2011-10-11: qty 1

## 2011-10-11 NOTE — Progress Notes (Signed)
Found pt on floor. On hands and knees. States, "I felt lightheaded." Denies pain at this time. States, "I landed on both my knees." Assessment complete. VS stable. Will continue to monitor. Kirtland Bouchard Schorr notified.

## 2011-10-11 NOTE — ED Provider Notes (Signed)
Medical screening examination/treatment/procedure(s) were performed by non-physician practitioner and as supervising physician I was immediately available for consultation/collaboration.  Celene Kras, MD 10/11/11 [phone]

## 2011-10-11 NOTE — Progress Notes (Signed)
Pt transferred from the ED around 0330, admitted to Rm [id number]. Pt comes from home with husband who is at her bedside currently. She is alert and oriented. Ambulatory with standby assist. No skin breakdown noted. Placed on telemetry. Oriented to room, instructed to call for assistance before getting out of bed. Still has complaints of abd pain, will treat and continue to monitor

## 2011-10-11 NOTE — ED Provider Notes (Signed)
Medical screening examination/treatment/procedure(s) were performed by non-physician practitioner and as supervising physician I was immediately available for consultation/collaboration.   Celene Kras, MD 10/11/11 1044

## 2011-10-11 NOTE — Progress Notes (Signed)
Patient admitted earlier today after an "accidental" intake of hydrogen peroxide on Sunday. Ever since has been having diffuse abdominal pain, nausea and some vomiting. No fevers/chills. History significant for a gastric bypass surgery in 2010. CT scan shows inflammation of the jejunal roux loop and distal jejunum, likely related to the ingestion of the toxic substance. Supportive care, will advance diet to clears as patient would like to try something to eat.  Peggye Pitt, MD [facility] Pager: [phone]

## 2011-10-11 NOTE — H&P (Signed)
[facility] History and Physical  Crosby Bevan ZOX:[id number] DOB: 1975/09/12 DOA: 10/10/2011  Referring physician: Oletta Lamas PCP: Pcp Not In System  [facility], [place]   Chief Complaint: abdominal pain  HPI:  36 yo female with accidental intake of hydrogen peroxide. Sunday.  Developed abd pain diffuse, sharp, n/v (blood?, Sunday),  denies fever, chills, cp, cough, palp, diarrhea, brbpr, black stool.   CT scan abd/pelvis =>negative for bowel obstruction, ileus ,   Review of Systems:  Negative for all organ systems except for + above  Past Medical History  Diagnosis Date  . Obesity    Past Surgical History  Procedure Date  . Gastric bypass 2010    at [facility]   Social History:  reports that she has never smoked. She does not have any smokeless tobacco history on file. She reports that she does not drink alcohol or use illicit drugs. Lives with husband, has 1 child Able to perform all ADLS  Allergies  Allergen Reactions  . Morphine And Related Rash  . Tetracyclines & Related Rash    Family History  Problem Relation Age of Onset  . Cancer      grandfather had pancreatic cancer    (be sure to complete)  Prior to Admission medications   Medication Sig Start Date End Date Taking? Authorizing Provider  acetaminophen (TYLENOL) 500 MG tablet Take 1,000 mg by mouth every 6 (six) hours as needed. For pain   Yes Historical Provider, MD  Multiple Vitamins-Minerals (MULTIVITAMIN WITH MINERALS) tablet Take 1 tablet by mouth daily.   Yes Historical Provider, MD  pantoprazole (PROTONIX) 20 MG tablet Take 1 tablet (20 mg total) by mouth daily. 10/09/11 10/08/12 Yes Toy Baker, MD  sucralfate (CARAFATE) 1 G tablet Take 1 tablet (1 g total) by mouth 4 (four) times daily. 10/09/11 10/08/12 Yes Toy Baker, MD   Physical Exam: Filed Vitals:   10/10/11 1954 10/11/11 0113  BP: 162/97 151/77  Pulse: 86 72  Temp: 98.6 F (37 C)   TempSrc: Oral   Resp: 18 20  SpO2: 99% 100%       General:  nad  Eyes: anicteric  ENT: mmm  Neck: no jvd, no bruit, no tm  Cardiovascular: rrr s1, s2  Respiratory:  ctab  Abdomen: soft, obese, nt, nd, +bs  Skin: no rash  Musculoskeletal: wnl  Psychiatric: axox3  Neurologic: nonfocal  Labs on Admission:  Basic Metabolic Panel:  Lab 10/10/11 8119 10/09/11 0857  NA 134* 137  K 3.2* 4.0  CL 100 105  CO2 26 23  GLUCOSE 112* 129*  BUN 6 14  CREATININE 0.71 0.69  CALCIUM 9.0 8.7  MG -- --  PHOS -- --   Liver Function Tests:  Lab 10/10/11 2028 10/09/11 0857  AST 17 29  ALT 13 16  ALKPHOS 66 63  BILITOT 0.3 0.2*  PROT 7.0 6.9  ALBUMIN 3.4* 3.5    Lab 10/10/11 2028 10/09/11 0857  LIPASE 17 20  AMYLASE -- --   No results found for this basename: AMMONIA:5 in the last 168 hours CBC:  Lab 10/10/11 2002 10/09/11 0857  WBC 19.0* 7.7  NEUTROABS 16.3* 6.4  HGB 10.9* 11.1*  HCT 34.2* 36.6  MCV 78.3 79.9  PLT 277 271   Cardiac Enzymes: No results found for this basename: CKTOTAL:5,CKMB:5,CKMBINDEX:5,TROPONINI:5 in the last 168 hours  BNP (last 3 results) No results found for this basename: PROBNP:3 in the last 8760 hours CBG: No results found for this  basename: GLUCAP:5 in the last 168 hours  Radiological Exams on Admission: Ct Abdomen Pelvis W Contrast  10/10/2011  *RADIOLOGY REPORT*  Clinical Data: Mid and lower abdominal pain.  Nausea and vomiting.  CT ABDOMEN AND PELVIS WITH CONTRAST  Technique:  Multidetector CT imaging of the abdomen and pelvis was performed following the standard protocol during bolus administration of intravenous contrast.  Contrast: OMNIPAQUE IOHEXOL 300 MG/ML  SOLN  Comparison: None.  Findings: The lung bases are clear.  The liver, spleen, gallbladder, pancreas, adrenal glands, kidneys, and abdominal aorta are unremarkable.  Mild prominence of celiac axis and retroperitoneal lymph nodes without significant pathological enlargement suggesting reactive nodes.  There are  postoperative changes consistent with gastric bypass surgery.  The jejunal roux loop and left lower quadrant jejunum distal to the anastomoses demonstrate diffuse wall thickening.  This could represent enteritis or ischemia.  No pneumatosis or portal venous gas.  No mesenteric infiltration.  No small bowel distension.  The native stomach is decompressed.  No colonic distension or wall thickening.  No free air or free fluid in the abdomen.  Pelvis:  The uterus is enlarged, measuring 9.9 x 9.3 by 14.9 cm. Changes suggest fibroid uterus.  Small calcifications centrally. The adnexal structures are not enlarged.  Small amount of free fluid in the pelvis may be physiologic.  Bladder wall is not thickened.  No loculated fluid collections.  The appendix is normal.  No evidence of diverticulitis.  Mild degenerative changes in the lumbar spine.  Scarring in the anterior abdominal wall is likely old postsurgical change.  IMPRESSION: Postoperative changes of prior gastric bypass with regional thickening of the jejunal roux loop and distal jejunum most likely representing enteritis.  Focal ischemia not entirely excluded. Mesenteric lymph nodes are prominent but not pathologically enlarged, likely representing reactive nodes.  Diffuse enlargement of the uterus consistent with fibroid uterus.   Original Report Authenticated By: Marlon Pel, M.D.    Dg Abd Acute W/chest  10/09/2011  *RADIOLOGY REPORT*  Clinical Data: Mid abdominal and back pain for 6 hours with nausea and vomiting.  ACUTE ABDOMEN SERIES (ABDOMEN 2 VIEW & CHEST 1 VIEW)  Comparison: Plain film chest 02/26/2011  Findings: Frontal view of the chest demonstrates midline trachea. Normal heart size and mediastinal contours. No pleural effusion or pneumothorax.  Clear lungs.  Abominal films demonstrate no free intraperitoneal air or significant air fluid levels on upright positioning.  Surgical sutures in the left upper quadrant suspected.  No significant bowel  dilatation on supine imaging.  Phleboliths in the pelvis.  Minimal distal gas. No abnormal abdominal calcifications.   No appendicolith.  IMPRESSION: No acute findings.  Original Report Authenticated By: Consuello Bossier, M.D.    EKG:   Assessment/Plan Active Problems:  Nausea & vomiting  Hypokalemia  Abdominal pain   1. Abdominal pain:  Improving,  Cont to monitor,  obesrve and if doing well can be discharged home 2. Hypokalemia: potassium repleted in ED,  3. N/v: resolved. Zofran/phenergan prn  Code Status: Full Family Communication:  Disposition Plan: home Time spent: 40 minutes  Pearson Grippe [facility] Pager [phone]  If 7PM-7AM, please contact night-coverage [email or web address] Password [email or web address] 10/11/2011, 1:53 AM

## 2011-10-12 DIAGNOSIS — E876 Hypokalemia: Secondary | ICD-10-CM

## 2011-10-12 DIAGNOSIS — K920 Hematemesis: Secondary | ICD-10-CM | POA: Diagnosis present

## 2011-10-12 LAB — CBC: Platelets: 223 10*3/uL (ref 150–400)

## 2011-10-12 LAB — BASIC METABOLIC PANEL
BUN: 4 mg/dL — ABNORMAL LOW (ref 6–23)
Chloride: 102 mEq/L (ref 96–112)
Creatinine, Ser: 0.68 mg/dL (ref 0.50–1.10)
GFR calc Af Amer: >90 mL/min (ref >90)

## 2011-10-12 LAB — HEMOGLOBIN AND HEMATOCRIT, BLOOD
HCT: 29.4 % — ABNORMAL LOW (ref 36.0–46.0)
Hemoglobin: 9.2 g/dL — ABNORMAL LOW (ref 12.0–15.0)

## 2011-10-12 MED ORDER — OXYCODONE HCL 5 MG PO TABS
5.0000 mg | ORAL_TABLET | ORAL | Status: DC | PRN
  Administered 2011-10-12 – 2011-10-13 (×3): 10 mg via ORAL
  Filled 2011-10-12 (×3): qty 2

## 2011-10-12 MED ORDER — SODIUM CHLORIDE 0.9 % IV BOLUS (SEPSIS)
1000.0000 mL | Freq: Once | INTRAVENOUS | Status: AC
  Administered 2011-10-12: 1000 mL via INTRAVENOUS

## 2011-10-12 NOTE — Progress Notes (Signed)
Nursing Note  Pt called this RN into the room to say that she had just voided in the "hat" in the toilet and noticed blood in her urine. Pt states she is not on her menstrual cycle. She states she's unsure if the blood is from her urine or from her vagina. States it had happened x1 on day shift and the RN told her to put a pad in her underwear and monitor. No blood noted on pad, but urine is dark pink in color. MD notified - orders rec'd for labs and I&O cath for UA. Pt had just voided . Will recheck in about an hour. C.Zaviyar Rahal, RN.

## 2011-10-12 NOTE — Consult Note (Signed)
Referring Provider: No ref. provider found Primary Care Physician:  Dr. Ihor Dow Primary Gastroenterologist:  None (unassigned)  Reason for Consultation:  Abdominal pain, nausea, and vomiting  HPI: Heather Henson is a 36 y.o. female who accidentally ingested a sip of hydrogen peroxide at home 3 days ago, and after doing so, had vomiting of moderate amounts of blood, which has subsequently resolved, but she has been left with intolerance of intake of even small amounts of liquids, which immediately cause diffuse abdominal pain. She also has intermittent diffuse abdominal pain just lying in bed.  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 The patient feels globally much better since that weight loss occurred. She and her husband are attempting to achieve pregnancy at this time.  Evaluation on admission has included a negative urine pregnancy test and a CT scan which showed some thickening of loops of small bowel raising the question of enteritis or ischemia. The patient's white count has fallen progressively while in the hospital, and she has been consistently afebrile.  At baseline, the patient is without GI complaints, such as reflux, abdominal pain, constipation or diarrhea.   Past Medical History  Diagnosis Date  . Obesity   . Fibroids   . Hypertension     Past Surgical History  Procedure Date  . Gastric bypass 2010    at [facility]    Prior to Admission medications   Medication Sig Start Date End Date Taking? Authorizing Provider  acetaminophen (TYLENOL) 500 MG tablet Take 1,000 mg by mouth every 6 (six) hours as needed. For pain   Yes Historical Provider, MD  Multiple Vitamins-Minerals (MULTIVITAMIN WITH MINERALS) tablet Take 1 tablet by mouth daily.   Yes Historical Provider, MD  pantoprazole (PROTONIX) 20 MG tablet Take 1 tablet (20 mg total) by mouth daily. 10/09/11 10/08/12 Yes  Toy Baker, MD  sucralfate (CARAFATE) 1 G tablet Take 1 tablet (1 g total) by mouth 4 (four) times daily. 10/09/11 10/08/12 Yes Toy Baker, MD    Current Facility-Administered Medications  Medication Dose Route Frequency Provider Last Rate Last Dose  . acetaminophen (TYLENOL) tablet 1,000 mg  1,000 mg Oral Q6H PRN Massie Maroon, MD   1,000 mg at 10/12/11 0343  . antiseptic oral rinse (BIOTENE) solution 15 mL  15 mL Mouth Rinse q12n4p Massie Maroon, MD   15 mL at 10/12/11 1200  . chlorhexidine (PERIDEX) 0.12 % solution 15 mL  15 mL Mouth Rinse BID Massie Maroon, MD   15 mL at 10/12/11 [phone]  . diphenhydrAMINE (BENADRYL) capsule 50 mg  50 mg Oral Q6H PRN Leanne Chang, NP   50 mg at 10/12/11 0014  . HYDROmorphone (DILAUDID) injection 1 mg  1 mg Intravenous Q4H PRN Henderson Cloud, MD   1 mg at 10/12/11 1528  . ondansetron (ZOFRAN) injection 4 mg  4 mg Intravenous Q6H PRN Massie Maroon, MD   4 mg at 10/11/11 1831  . pantoprazole (PROTONIX) EC tablet 20 mg  20 mg Oral Daily Massie Maroon, MD   20 mg at 10/12/11 1350  . sodium chloride 0.9 % bolus 1,000 mL  1,000 mL Intravenous Once Altha Harm, MD   1,000 mL at 10/12/11 1515  . sodium chloride 0.9 % injection 3 mL  3 mL Intravenous Q12H Massie Maroon, MD   3 mL at 10/12/11 1100  . sucralfate (  CARAFATE) tablet 1 g  1 g Oral QID Massie Maroon, MD   1 g at 10/12/11 1350  . DISCONTD: diphenhydrAMINE (BENADRYL) capsule 50 mg  50 mg Oral Q6H PRN Leanne Chang, NP        Allergies as of 10/10/2011 - Review Complete 10/10/2011  Allergen Reaction Noted  . Morphine and related Rash 08/12/2011  . Tetracyclines & related Rash 08/12/2011    Family History  Problem Relation Age of Onset  . Cancer      grandfather had pancreatic cancer    History   Social History  . Marital Status: Married    Spouse Name: N/A    Number of Children: N/A  . Years of Education: N/A   Occupational History  .      Multimedia programmer    Social History Main Topics  . Smoking status: Never Smoker   . Smokeless tobacco: Never Used  . Alcohol Use: No  . Drug Use: No  . Sexually Active: Yes    Birth Control/ Protection: None   Other Topics Concern  . Not on file   Social History Narrative  . No narrative on file    Review of Systems: See history of present illness. No prodromal GI complaints.  Physical examination: Vital signs in last 24 hours: Temp:  [97.8 F (36.6 C)-98.7 F (37.1 C)] 97.8 F (36.6 C) (08/21 1412) Pulse Rate:  [61-85] 80  (08/21 1418) Resp:  [17-20] 18  (08/21 1412) BP: (133-159)/(70-116) 136/91 mmHg (08/21 1418) SpO2:  [98 %-100 %] 98 % (08/21 1412) Weight:  [112.311 kg (247 lb 9.6 oz)] 112.311 kg (247 lb 9.6 oz) (08/20 2008) Last BM Date: 10/09/11 General:   Alert,  Well-developed, well-nourished, pleasant and cooperative in NAD. Although shades are drawn in the room in the room is extremely dark. Her husband is at the bedside. Head:  Normocephalic and atraumatic. Eyes:  Sclera clear, no icterus.   Lungs:  Clear throughout to auscultation.   No wheezes, crackles, or rhonchi. No evident respiratory distress. Heart:   Regular rate and rhythm; no murmurs, clicks, rubs,  or gallops. Abdomen:  Soft, nontender, and nondistended but moderately severe residual obesity. No masses, hepatosplenomegaly or ventral hernias noted. Quiet bowel sounds status post recent pain medication, without bruits, guarding, or rebound.   Msk:   Symmetrical without gross deformities. Neurologic:  Alert and coherent;  grossly normal neurologically. Psych:   Alert and cooperative. Normal mood and affect.  Intake/Output from previous day: 08/20 0701 - 08/21 0700 In: 160 [P.O.:160] Out: 1500 [Urine:1500] Intake/Output this shift: Total I/O In: 3 [I.V.:3] Out: 400 [Urine:400]  Lab Results:  [facility] 10/12/11 0605 10/11/11 0537 10/10/11 2002  WBC 15.8* 17.6* 19.0*  HGB 9.1* 9.4* 10.9*  HCT 29.3* 29.8* 34.2*   PLT 223 257 277   BMET  Basename 10/12/11 0605 10/11/11 0537 10/10/11 2002  NA 139 136 134*  K 3.2* 3.5 3.2*  CL 102 103 100  CO2 29 26 26   GLUCOSE 82 105* 112*  BUN 4* 6 6  CREATININE 0.68 0.68 0.71  CALCIUM 8.7 8.2* 9.0   LFT  Basename 10/11/11 0537 10/10/11 2028  PROT 5.9* --  ALBUMIN 2.8* --  AST 15 --  ALT 10 --  ALKPHOS 59 --  BILITOT 0.2* --  BILIDIR -- <0.1  IBILI -- NOT CALCULATED   PT/INR No results found for this basename: LABPROT:2,INR:2 in the last 72 hours   Studies/Results: Ct Abdomen Pelvis W Contrast  10/10/2011  *RADIOLOGY REPORT*  Clinical Data: Mid and lower abdominal pain.  Nausea and vomiting.  CT ABDOMEN AND PELVIS WITH CONTRAST  Technique:  Multidetector CT imaging of the abdomen and pelvis was performed following the standard protocol during bolus administration of intravenous contrast.  Contrast: OMNIPAQUE IOHEXOL 300 MG/ML  SOLN  Comparison: None.  Findings: The lung bases are clear.  The liver, spleen, gallbladder, pancreas, adrenal glands, kidneys, and abdominal aorta are unremarkable.  Mild prominence of celiac axis and retroperitoneal lymph nodes without significant pathological enlargement suggesting reactive nodes.  There are postoperative changes consistent with gastric bypass surgery.  The jejunal roux loop and left lower quadrant jejunum distal to the anastomoses demonstrate diffuse wall thickening.  This could represent enteritis or ischemia.  No pneumatosis or portal venous gas.  No mesenteric infiltration.  No small bowel distension.  The native stomach is decompressed.  No colonic distension or wall thickening.  No free air or free fluid in the abdomen.  Pelvis:  The uterus is enlarged, measuring 9.9 x 9.3 by 14.9 cm. Changes suggest fibroid uterus.  Small calcifications centrally. The adnexal structures are not enlarged.  Small amount of free fluid in the pelvis may be physiologic.  Bladder wall is not thickened.  No loculated fluid  collections.  The appendix is normal.  No evidence of diverticulitis.  Mild degenerative changes in the lumbar spine.  Scarring in the anterior abdominal wall is likely old postsurgical change.  IMPRESSION: Postoperative changes of prior gastric bypass with regional thickening of the jejunal roux loop and distal jejunum most likely representing enteritis.  Focal ischemia not entirely excluded. Mesenteric lymph nodes are prominent but not pathologically enlarged, likely representing reactive nodes.  Diffuse enlargement of the uterus consistent with fibroid uterus.   Original Report Authenticated By: Marlon Pel, M.D.     Impression: 1. Minimal ingestion of hydrogen peroxide several days ago 2. Transient hematemesis, suggestive of Mallory-Weiss tear, resolved 3. Anemia, with 2 g drop in hemoglobin since admission but persistently normal BUN suggesting chronic anemia rather than acute GI blood loss as the cause 4. Diffuse abdominal pain, triggered or exacerbated by consumption of minimal quantities of liquid in a pattern that sound somewhat non-physiologic 5. Small intestinal wall thickening on admission CT of significant 6.  leukocytosis, improving  Discussion: Overall, I do not get the impression that this patient has a significant underlying acute pathologic process  Plan: The patient was offered the options of observation, endoscopic evaluation (nature, purpose, risks reviewed with patient and husband), and radiographic evaluation of the GI tract. Since she seems to be getting better (white count decreasing, no vomiting today), I have recommended observation, and she is agreeable with this. I have recommended frequent small aliquots of liquid (15 ML every 30 minutes while awake) on a trial basis overnight. I will reassess in the morning and, if that is not tolerated, the next that would be endoscopic evaluation. If that is tolerated, we will probably try to increase the size of the fluid bolus,  and gradually advance.   LOS: 2 days   Royalty Fakhouri V  10/12/2011, 4:02 PM

## 2011-10-12 NOTE — Progress Notes (Signed)
C/o itching. Requesting benadryl. Will notify K. Schorr PA.

## 2011-10-12 NOTE — Progress Notes (Signed)
Event: 2220: Notified by RN that pt was found on her hands and knees in BR. She stated she had to drop to her hands and knees because she became very dizzy after getting OOB to go to BR. Denies full fall. States she did go down on her knees. Assisted back to bed. NP to bedside. Subjective: Upon arrival to bedside pt reports feeling better. Dizziness has resolved. Pt denies hitting head or having complete fall to floor. Reports mild pain to bil knees. Denies CP, SOB, diaphoresis or other associated symptoms. Objective: Pt noted resting quietly in bed in no acute distress. Pt is AA&Ox3. MAE's x 4. PEARLA, EOMI, BBS CTA, HR-80 w/ RRR and w/o M/G/R,abd soft, w/ mild diffuse TTP and normal bs in all quads. No obvious injury to either knee, no swelling, redness, deformity or open wound. No pain w/ passive ROM. Orthostatic VS- Lying  153/83 P70, sitting-150/81 P-74, standing-133/116 P-85. No other injuries noted or reported. Assessment/Plan: 1. Soft fall s/p episode of dizziness: Likely orthostasis. VSS, no objective injury. Will request BRP's  and ambulation w/ assistance only for now. Will place bed monitor, side rails x 3 and continue to monitor closely.

## 2011-10-12 NOTE — Consult Note (Signed)
Attempted to see patient in room, however MD was examining patient and asked to return. Met with MD, discussed need for consult and MD reports no need. Patient accidentally drank the hydrogen peroxide which was on the night stand with her water. Immediately she knew what she had done, acted appropriately and this was not an attempt to hurt herself.  Will cancel the consult at this time, as there are no needs.  Will updated Psych MD and notify.  No needs, will sign off.  Ashley Jacobs, MSW LCSW [phone]

## 2011-10-12 NOTE — Progress Notes (Signed)
Nursing Note  Attempted to do I&O cath on patient. Was unsuccessful on first attempt - catheter went into pt's vagina - dark red blood noted in catheter tubing. Charge nurse said she will attempt when time is available. Will monitor. C.Kennede Lusk, RN.

## 2011-10-12 NOTE — Progress Notes (Signed)
Subjective: Patient reports that she had a bottle of water and a bottle of hydrogen peroxide at her bedside. During the night she got to take a drink of water and accidentally ingested 100 peroxide. She notes that she takes a large ago. She states that when she realized that she take nitroglycerin peroxide she call the [facility] and advised her to drink water immediately. The patient states she presented to the emergency room after having hematemesis was observed and discharged home. She states that she didn't have hematemesis on several days and her last episode of hematemesis was before yesterday while hospitalized. She also states that she's had several episodes of emesis nonbilious no hematemesis ever since. At this point the patient's major concern is nausea and inability to tolerate oral intake. 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 Objective: Filed Vitals:   10/12/11 1414 10/12/11 1416 10/12/11 1418 10/12/11 1643  BP: 159/88 139/87 136/91 152/84  Pulse: 61 73 80 61  Temp:    98.3 F (36.8 C)  TempSrc:    Oral  Resp:    18  Height:      Weight:      SpO2:    100%   Weight change: 1.211 kg (2 lb 10.7 oz)  Intake/Output Summary (Last 24 hours) at 10/12/11 1920 Last data filed at 10/12/11 1700  Gross per 24 hour  Intake    123 ml  Output   2200 ml  Net  -2077 ml    General: Alert, awake, oriented x3, in no acute distress.  OROPHARYNX:  Moist, No exudate/ erythema/lesions.  Heart: Regular rate and rhythm.  Lungs: Clear to auscultation. Abdomen: Soft, diffusely tender and obese, nondistended, positive bowel sounds, no masses no hepatosplenomegaly noted. Neuro: No focal neurological deficits noted cranial nerves II through XII grossly intact. Musculoskeletal: No warm swelling or erythema around joints, no spinal tenderness noted. Psychiatric: Patient alert and oriented x3, good insight and cognition, good recent to remote recall.   Lab  Results:  [facility] 10/12/11 0605 10/11/11 0537  NA 139 136  K 3.2* 3.5  CL 102 103  CO2 29 26  GLUCOSE 82 105*  BUN 4* 6  CREATININE 0.68 0.68  CALCIUM 8.7 8.2*  MG -- --  PHOS -- --    Basename 10/11/11 0537 10/10/11 2028  AST 15 17  ALT 10 13  ALKPHOS 59 66  BILITOT 0.2* 0.3  PROT 5.9* 7.0  ALBUMIN 2.8* 3.4*    Basename 10/10/11 2028  LIPASE 17  AMYLASE --    Basename 10/12/11 0605 10/11/11 0537 10/10/11 2002  WBC 15.8* 17.6* --  NEUTROABS -- 15.0* 16.3*  HGB 9.1* 9.4* --  HCT 29.3* 29.8* --  MCV 79.6 79.9 --  PLT 223 257 --   No results found for this basename: CKTOTAL:3,CKMB:3,CKMBINDEX:3,TROPONINI:3 in the last 72 hours No components found with this basename: POCBNP:3 No results found for this basename: DDIMER:2 in the last 72 hours No results found for this basename: HGBA1C:2 in the last 72 hours No results found for this basename: CHOL:2,HDL:2,LDLCALC:2,TRIG:2,CHOLHDL:2,LDLDIRECT:2 in the last 72 hours No results found for this basename: TSH,T4TOTAL,FREET3,T3FREE,THYROIDAB in the last 72 hours No results found for this basename: VITAMINB12:2,FOLATE:2,FERRITIN:2,TIBC:2,IRON:2,RETICCTPCT:2 in the last 72 hours  Micro Results: No results found for this or any previous visit (from the past 240 hour(s)).  Studies/Results: Ct Abdomen Pelvis W Contrast  10/10/2011  *RADIOLOGY REPORT*  Clinical Data: Mid and lower abdominal pain.  Nausea and  vomiting.  CT ABDOMEN AND PELVIS WITH CONTRAST  Technique:  Multidetector CT imaging of the abdomen and pelvis was performed following the standard protocol during bolus administration of intravenous contrast.  Contrast: OMNIPAQUE IOHEXOL 300 MG/ML  SOLN  Comparison: None.  Findings: The lung bases are clear.  The liver, spleen, gallbladder, pancreas, adrenal glands, kidneys, and abdominal aorta are unremarkable.  Mild prominence of celiac axis and retroperitoneal lymph nodes without significant pathological enlargement  suggesting reactive nodes.  There are postoperative changes consistent with gastric bypass surgery.  The jejunal roux loop and left lower quadrant jejunum distal to the anastomoses demonstrate diffuse wall thickening.  This could represent enteritis or ischemia.  No pneumatosis or portal venous gas.  No mesenteric infiltration.  No small bowel distension.  The native stomach is decompressed.  No colonic distension or wall thickening.  No free air or free fluid in the abdomen.  Pelvis:  The uterus is enlarged, measuring 9.9 x 9.3 by 14.9 cm. Changes suggest fibroid uterus.  Small calcifications centrally. The adnexal structures are not enlarged.  Small amount of free fluid in the pelvis may be physiologic.  Bladder wall is not thickened.  No loculated fluid collections.  The appendix is normal.  No evidence of diverticulitis.  Mild degenerative changes in the lumbar spine.  Scarring in the anterior abdominal wall is likely old postsurgical change.  IMPRESSION: Postoperative changes of prior gastric bypass with regional thickening of the jejunal roux loop and distal jejunum most likely representing enteritis.  Focal ischemia not entirely excluded. Mesenteric lymph nodes are prominent but not pathologically enlarged, likely representing reactive nodes.  Diffuse enlargement of the uterus consistent with fibroid uterus.   Original Report Authenticated By: Marlon Pel, M.D.    Dg Hysterogram (hsg)  09/27/2011  *RADIOLOGY REPORT*  Clinical Data:  History of left ectopic, fibroidectomy, miscarriage  HYSTEROSALPINGOGRAM  Technique:  Following cleansing of the cervix and vagina with Betadine solution, a hysterosalpingogram was performed using a 5- [place] hysterosalpingogram catheter and Omnipaque 300 contrast. The patient tolerated the examination without difficulty.  Fluoroscopy time: 0.8 minutes.  Findings: The intrauterine cavity has a normal contour and appearance with no fixed filling defects to suggest  submucosal fibroids or polyps.  The right fallopian tube has a normal appearance, and there is free spill of contrast into the right adnexal region.  The left fallopian tube terminates at its fimbrial portion, consistent with history of prior left pelvic ectopic pregnancy with left oophorectomy.  IMPRESSION: Normal appearing endometrial cavity and patent right fallopian tube.  Non patent left fallopian tube.  Original Report Authenticated By: Brandon Melnick, M.D.   Dg Abd Acute W/chest  10/09/2011  *RADIOLOGY REPORT*  Clinical Data: Mid abdominal and back pain for 6 hours with nausea and vomiting.  ACUTE ABDOMEN SERIES (ABDOMEN 2 VIEW & CHEST 1 VIEW)  Comparison: Plain film chest 02/26/2011  Findings: Frontal view of the chest demonstrates midline trachea. Normal heart size and mediastinal contours. No pleural effusion or pneumothorax.  Clear lungs.  Abominal films demonstrate no free intraperitoneal air or significant air fluid levels on upright positioning.  Surgical sutures in the left upper quadrant suspected.  No significant bowel dilatation on supine imaging.  Phleboliths in the pelvis.  Minimal distal gas. No abnormal abdominal calcifications.   No appendicolith.  IMPRESSION: No acute findings.  Original Report Authenticated By: Consuello Bossier, M.D.    Medications: I have reviewed the patient's current medications. Scheduled Meds:   .  antiseptic oral rinse  15 mL Mouth Rinse q12n4p  . chlorhexidine  15 mL Mouth Rinse BID  . pantoprazole  20 mg Oral Daily  . sodium chloride  1,000 mL Intravenous Once  . sodium chloride  3 mL Intravenous Q12H  . sucralfate  1 g Oral QID   Continuous Infusions:  PRN Meds:.acetaminophen, diphenhydrAMINE, HYDROmorphone (DILAUDID) injection, ondansetron (ZOFRAN) IV, oxyCODONE, DISCONTD: diphenhydrAMINE Assessment/Plan: Patient Active Hospital Problem List: Nausea & vomiting (10/11/2011)   Assessment: Patient reports several episodes of hematemesis. This is not  consistent with congestion of hydrogen peroxide particularly so soon after as any oxidative damage would not occur immediately after result in hematemesis. My concern is a possible Mallory-Weiss tear. I asked gastroenterology to see the patient for consultation.     Hypokalemia (10/11/2011)   Assessment: Replete orally     Abdominal pain (10/11/2011)   Assessment: We'll ask gastroenterology to see.       LOS: 2 days

## 2011-10-13 LAB — COMPREHENSIVE METABOLIC PANEL
AST: 14 U/L (ref 0–37)
CO2: 32 mEq/L (ref 19–32)
Calcium: 8.7 mg/dL (ref 8.4–10.5)
Creatinine, Ser: 0.75 mg/dL (ref 0.50–1.10)
GFR calc Af Amer: >90 mL/min (ref >90)
GFR calc non Af Amer: >90 mL/min (ref >90)

## 2011-10-13 LAB — CBC WITH DIFFERENTIAL/PLATELET
Hemoglobin: 9.4 g/dL — ABNORMAL LOW (ref 12.0–15.0)
Lymphocytes Relative: 18 % (ref 12–46)
Lymphs Abs: 2 10*3/uL (ref 0.7–4.0)
MCH: 24.7 pg — ABNORMAL LOW (ref 26.0–34.0)
Monocytes Relative: 5 % (ref 3–12)
Neutro Abs: 7.8 10*3/uL — ABNORMAL HIGH (ref 1.7–7.7)
Neutrophils Relative %: 73 % (ref 43–77)
RBC: 3.81 MIL/uL — ABNORMAL LOW (ref 3.87–5.11)

## 2011-10-13 LAB — URINALYSIS, ROUTINE W REFLEX MICROSCOPIC
Glucose, UA: NEGATIVE mg/dL
Hgb urine dipstick: NEGATIVE
Specific Gravity, Urine: 1.017 (ref 1.005–1.030)

## 2011-10-13 MED ORDER — BOOST / RESOURCE BREEZE PO LIQD
1.0000 | Freq: Three times a day (TID) | ORAL | Status: DC
  Administered 2011-10-13 – 2011-10-14 (×3): 1 via ORAL

## 2011-10-13 NOTE — Progress Notes (Signed)
Subjective: Patient states that she feels much better today. Taking liquids in small aliquots appears to be working for patient. I appreciate the input from gastroenterology.  Objective: Filed Vitals:   10/13/11 1324 10/13/11 1428 10/13/11 1719 10/13/11 2038  BP: 136/75 132/73 138/98 138/76  Pulse: 64  57 60  Temp: 98 F (36.7 C)  97.8 F (36.6 C) 98.2 F (36.8 C)  TempSrc: Oral  Oral Oral  Resp: 19  19 19   Height:      Weight:    108.092 kg (238 lb 4.8 oz)  SpO2: 99%  100% 100%   Weight change: -0.811 kg (-1 lb 12.6 oz)  Intake/Output Summary (Last 24 hours) at 10/13/11 2133 Last data filed at 10/13/11 2042  Gross per 24 hour  Intake    280 ml  Output    600 ml  Net   -320 ml    General: Alert, awake, oriented x3, in no acute distress.  OROPHARYNX:  Moist, No exudate/ erythema/lesions.  Heart: Regular rate and rhythm.  Lungs: Clear to auscultation. Abdomen: Soft, diffusely tender and obese, nondistended, positive bowel sounds, no masses no hepatosplenomegaly noted. Neuro: No focal neurological deficits noted cranial nerves II through XII grossly intact. Musculoskeletal: No warm swelling or erythema around joints, no spinal tenderness noted. Psychiatric: Patient alert and oriented x3, good insight and cognition, good recent to remote recall.   Lab Results:  [facility] 10/13/11 0630 10/12/11 0605  NA 141 139  K 3.1* 3.2*  CL 102 102  CO2 32 29  GLUCOSE 96 82  BUN 4* 4*  CREATININE 0.75 0.68  CALCIUM 8.7 8.7  MG -- --  PHOS -- --    Basename 10/13/11 0630 10/11/11 0537  AST 14 15  ALT 9 10  ALKPHOS 75 59  BILITOT 0.2* 0.2*  PROT 6.0 5.9*  ALBUMIN 2.8* 2.8*   No results found for this basename: LIPASE:2,AMYLASE:2 in the last 72 hours  Basename 10/13/11 0630 10/12/11 2109 10/12/11 0605 10/11/11 0537  WBC 10.7* -- 15.8* --  NEUTROABS 7.8* -- -- 15.0*  HGB 9.4* 9.2* -- --  HCT 30.6* 29.4* -- --  MCV 80.3 -- 79.6 --  PLT 252 -- 223 --   No results found  for this basename: CKTOTAL:3,CKMB:3,CKMBINDEX:3,TROPONINI:3 in the last 72 hours No components found with this basename: POCBNP:3 No results found for this basename: DDIMER:2 in the last 72 hours No results found for this basename: HGBA1C:2 in the last 72 hours No results found for this basename: CHOL:2,HDL:2,LDLCALC:2,TRIG:2,CHOLHDL:2,LDLDIRECT:2 in the last 72 hours No results found for this basename: TSH,T4TOTAL,FREET3,T3FREE,THYROIDAB in the last 72 hours No results found for this basename: VITAMINB12:2,FOLATE:2,FERRITIN:2,TIBC:2,IRON:2,RETICCTPCT:2 in the last 72 hours  Micro Results: No results found for this or any previous visit (from the past 240 hour(s)).  Studies/Results: Ct Abdomen Pelvis W Contrast  10/10/2011  *RADIOLOGY REPORT*  Clinical Data: Mid and lower abdominal pain.  Nausea and vomiting.  CT ABDOMEN AND PELVIS WITH CONTRAST  Technique:  Multidetector CT imaging of the abdomen and pelvis was performed following the standard protocol during bolus administration of intravenous contrast.  Contrast: OMNIPAQUE IOHEXOL 300 MG/ML  SOLN  Comparison: None.  Findings: The lung bases are clear.  The liver, spleen, gallbladder, pancreas, adrenal glands, kidneys, and abdominal aorta are unremarkable.  Mild prominence of celiac axis and retroperitoneal lymph nodes without significant pathological enlargement suggesting reactive nodes.  There are postoperative changes consistent with gastric bypass surgery.  The jejunal roux loop and left lower  quadrant jejunum distal to the anastomoses demonstrate diffuse wall thickening.  This could represent enteritis or ischemia.  No pneumatosis or portal venous gas.  No mesenteric infiltration.  No small bowel distension.  The native stomach is decompressed.  No colonic distension or wall thickening.  No free air or free fluid in the abdomen.  Pelvis:  The uterus is enlarged, measuring 9.9 x 9.3 by 14.9 cm. Changes suggest fibroid uterus.  Small  calcifications centrally. The adnexal structures are not enlarged.  Small amount of free fluid in the pelvis may be physiologic.  Bladder wall is not thickened.  No loculated fluid collections.  The appendix is normal.  No evidence of diverticulitis.  Mild degenerative changes in the lumbar spine.  Scarring in the anterior abdominal wall is likely old postsurgical change.  IMPRESSION: Postoperative changes of prior gastric bypass with regional thickening of the jejunal roux loop and distal jejunum most likely representing enteritis.  Focal ischemia not entirely excluded. Mesenteric lymph nodes are prominent but not pathologically enlarged, likely representing reactive nodes.  Diffuse enlargement of the uterus consistent with fibroid uterus.   Original Report Authenticated By: Marlon Pel, M.D.    Dg Hysterogram (hsg)  09/27/2011  *RADIOLOGY REPORT*  Clinical Data:  History of left ectopic, fibroidectomy, miscarriage  HYSTEROSALPINGOGRAM  Technique:  Following cleansing of the cervix and vagina with Betadine solution, a hysterosalpingogram was performed using a 5- [place] hysterosalpingogram catheter and Omnipaque 300 contrast. The patient tolerated the examination without difficulty.  Fluoroscopy time: 0.8 minutes.  Findings: The intrauterine cavity has a normal contour and appearance with no fixed filling defects to suggest submucosal fibroids or polyps.  The right fallopian tube has a normal appearance, and there is free spill of contrast into the right adnexal region.  The left fallopian tube terminates at its fimbrial portion, consistent with history of prior left pelvic ectopic pregnancy with left oophorectomy.  IMPRESSION: Normal appearing endometrial cavity and patent right fallopian tube.  Non patent left fallopian tube.  Original Report Authenticated By: Brandon Melnick, M.D.   Dg Abd Acute W/chest  10/09/2011  *RADIOLOGY REPORT*  Clinical Data: Mid abdominal and back pain for 6 hours with nausea  and vomiting.  ACUTE ABDOMEN SERIES (ABDOMEN 2 VIEW & CHEST 1 VIEW)  Comparison: Plain film chest 02/26/2011  Findings: Frontal view of the chest demonstrates midline trachea. Normal heart size and mediastinal contours. No pleural effusion or pneumothorax.  Clear lungs.  Abominal films demonstrate no free intraperitoneal air or significant air fluid levels on upright positioning.  Surgical sutures in the left upper quadrant suspected.  No significant bowel dilatation on supine imaging.  Phleboliths in the pelvis.  Minimal distal gas. No abnormal abdominal calcifications.   No appendicolith.  IMPRESSION: No acute findings.  Original Report Authenticated By: Consuello Bossier, M.D.    Medications: I have reviewed the patient's current medications. Scheduled Meds:    . antiseptic oral rinse  15 mL Mouth Rinse q12n4p  . feeding supplement  1 Container Oral TID BM  . pantoprazole  20 mg Oral Daily  . sodium chloride  3 mL Intravenous Q12H  . sucralfate  1 g Oral QID   Continuous Infusions:  PRN Meds:.acetaminophen, diphenhydrAMINE, HYDROmorphone (DILAUDID) injection, ondansetron (ZOFRAN) IV, oxyCODONE Assessment/Plan: Patient Active Hospital Problem List: Nausea & vomiting (10/11/2011)   Assessment: I appreciate input from Dr. Matthias Hughs. Patient will continue with conservative treatment and diet advance as tolerated.   Hypokalemia (10/11/2011)   Assessment: Replete orally  Abdominal pain (10/11/2011)   Assessment: Improved.     Disposition: Anticipate discharge home within the next 24 hours.   LOS: 3 days

## 2011-10-13 NOTE — Progress Notes (Signed)
Tolerated frequent small aliquots of clear liquids yesterday afternoon and evening.  This morning, she feels much better, and in fact, feels ready to advance her diet.  On exam, she is sitting up in a chair, instead of lying in bed like yesterday. She looks much more animated and in good spirits.  Impression: Resolving nonspecific intestinal dysfunction, perhaps anxiety related dysmotility  Recommendation: Okay to advance diet as tolerated. I anticipate that, if the current trend toward improvement continues, that the patient will be ready for discharge tomorrow.  Heather Henson, M.D. [phone]

## 2011-10-13 NOTE — Evaluation (Signed)
Physical Therapy Evaluation  (One Time) and Discharge Patient Details Name: Heather Henson MRN: [id number] DOB: September 10, 1975 Today's Date: 10/13/2011 Time: 8469-6295 PT Time Calculation (min): 9 min  PT Assessment / Plan / Recommendation Clinical Impression  Pt. admitted after accidentally ingesting hydrogen peroxide and with symptoms of abd. pain , N/V, enteritis, hematemesis.  Pt is independent in all gross mobility tasks and does not appear to need acute PT.  Will sign off.    PT Assessment  Patent does not need any further PT services    Follow Up Recommendations  No PT follow up    Barriers to Discharge        Equipment Recommendations  None recommended by PT    Recommendations for Other Services     Frequency      Precautions / Restrictions Precautions Precautions: None Restrictions Weight Bearing Restrictions: No   Pertinent Vitals/Pain No pain, no distress      Mobility  Bed Mobility Bed Mobility: Supine to Sit Supine to Sit: 7: Independent Details for Bed Mobility Assistance: manages independently Transfers Transfers: Sit to Stand;Stand to Sit Sit to Stand: 7: Independent Stand to Sit: 7: Independent Ambulation/Gait Ambulation/Gait Assistance: 7: Independent Ambulation Distance (Feet): 200 Feet Assistive device: None Ambulation/Gait Assistance Details: normal gait pattern, no overt LOB Gait Pattern: Within Functional Limits Gait velocity: normal    Exercises     PT Diagnosis:    PT Problem List:   PT Treatment Interventions:     PT Goals    Visit Information  Last PT Received On: 10/13/11 Assistance Needed: +1    Subjective Data  Subjective: I'll get to go home when I can keep something down Patient Stated Goal: return to biking, going to the gym, hiking and work   Prior Functioning  Home Living Lives With: Spouse Available Help at Discharge: Family;Available PRN/intermittently Type of Home: Apartment Home Access: Level entry Home Layout:  One level Bathroom Shower/Tub: Engineer, manufacturing systems: Standard Home Adaptive Equipment: None Prior Function Level of Independence: Independent Driving: Yes Vocation: Full time employment Comments: Pt. works in [employer], [employer] Communication Communication: No difficulties    Cognition  Overall Cognitive Status: Appears within functional limits for tasks assessed/performed Arousal/Alertness: Awake/alert Orientation Level: Oriented X4 / Intact Behavior During Session: WFL for tasks performed    Extremity/Trunk Assessment Right Upper Extremity Assessment RUE ROM/Strength/Tone: WFL for tasks assessed Left Upper Extremity Assessment LUE ROM/Strength/Tone: WFL for tasks assessed Right Lower Extremity Assessment RLE ROM/Strength/Tone: Within functional levels Left Lower Extremity Assessment LLE ROM/Strength/Tone: Within functional levels Trunk Assessment Trunk Assessment: Normal   Balance    End of Session PT - End of Session Activity Tolerance: Patient tolerated treatment well Patient left: in chair;with call bell/phone within reach Nurse Communication: Mobility status  GP     Ferman Hamming 10/13/2011, 12:20 PM Weldon Picking PT Acute Rehab Services [phone] [place] [phone]

## 2011-10-14 DIAGNOSIS — I10 Essential (primary) hypertension: Secondary | ICD-10-CM

## 2011-10-14 LAB — BASIC METABOLIC PANEL
Chloride: 101 mEq/L (ref 96–112)
Creatinine, Ser: 0.77 mg/dL (ref 0.50–1.10)
GFR calc Af Amer: >90 mL/min (ref >90)
GFR calc non Af Amer: >90 mL/min (ref >90)
Potassium: 3 mEq/L — ABNORMAL LOW (ref 3.5–5.1)

## 2011-10-14 MED ORDER — POTASSIUM CHLORIDE CRYS ER 20 MEQ PO TBCR
40.0000 meq | EXTENDED_RELEASE_TABLET | ORAL | Status: DC
  Administered 2011-10-14: 40 meq via ORAL
  Filled 2011-10-14: qty 2

## 2011-10-14 MED ORDER — PANTOPRAZOLE SODIUM 20 MG PO TBEC: 20.0000 mg | DELAYED_RELEASE_TABLET | Freq: Every day | ORAL | Status: DC

## 2011-10-14 MED ORDER — POTASSIUM CHLORIDE CRYS ER 20 MEQ PO TBCR: 40.0000 meq | EXTENDED_RELEASE_TABLET | Freq: Every day | ORAL | Status: DC

## 2011-10-14 MED ORDER — BOOST / RESOURCE BREEZE PO LIQD: 1.0000 | Freq: Three times a day (TID) | ORAL | Status: DC

## 2011-10-14 NOTE — Progress Notes (Signed)
Tolerated solid food last night, and is feeling well this morning.  She feels ready to go home, and I would agree with discharging her.  Will sign off.    No GI follow-up is required, but the patient has my card in case she needs to reach me.  Please call if I can be of further assistance with this patient's case.  Ria Clock, M.D. [phone]

## 2011-10-14 NOTE — Discharge Summary (Signed)
Heather Henson MRN: [id number] DOB/AGE: Dec 20, 1975 36 y.o.  Admit date: 10/10/2011 Discharge date: 10/14/2011  Primary Care Physician:  Pcp Not In System   Discharge Diagnoses:   Patient Active Problem List  Diagnosis  . Nausea & vomiting  . Hypokalemia  . Abdominal pain  . Hematemesis    DISCHARGE MEDICATION: Medication List  As of 10/14/2011 12:08 PM   TAKE these medications         acetaminophen 500 MG tablet   Commonly known as: TYLENOL   Take 1,000 mg by mouth every 6 (six) hours as needed. For pain      feeding supplement Liqd   Take 1 Container by mouth 3 (three) times daily between meals.      multivitamin with minerals tablet   Take 1 tablet by mouth daily.      pantoprazole 20 MG tablet   Commonly known as: PROTONIX   Take 1 tablet (20 mg total) by mouth daily.      potassium chloride SA 20 MEQ tablet   Commonly known as: K-DUR,KLOR-CON   Take 2 tablets (40 mEq total) by mouth daily.      sucralfate 1 G tablet   Commonly known as: CARAFATE   Take 1 tablet (1 g total) by mouth 4 (four) times daily.              Consults: Treatment Team:  Florencia Reasons, MD   SIGNIFICANT DIAGNOSTIC STUDIES:  Ct Abdomen Pelvis W Contrast  10/10/2011  *RADIOLOGY REPORT*  Clinical Data: Mid and lower abdominal pain.  Nausea and vomiting.  CT ABDOMEN AND PELVIS WITH CONTRAST  Technique:  Multidetector CT imaging of the abdomen and pelvis was performed following the standard protocol during bolus administration of intravenous contrast.  Contrast: OMNIPAQUE IOHEXOL 300 MG/ML  SOLN  Comparison: None.  Findings: The lung bases are clear.  The liver, spleen, gallbladder, pancreas, adrenal glands, kidneys, and abdominal aorta are unremarkable.  Mild prominence of celiac axis and retroperitoneal lymph nodes without significant pathological enlargement suggesting reactive nodes.  There are postoperative changes consistent with gastric bypass surgery.  The jejunal roux loop and  left lower quadrant jejunum distal to the anastomoses demonstrate diffuse wall thickening.  This could represent enteritis or ischemia.  No pneumatosis or portal venous gas.  No mesenteric infiltration.  No small bowel distension.  The native stomach is decompressed.  No colonic distension or wall thickening.  No free air or free fluid in the abdomen.  Pelvis:  The uterus is enlarged, measuring 9.9 x 9.3 by 14.9 cm. Changes suggest fibroid uterus.  Small calcifications centrally. The adnexal structures are not enlarged.  Small amount of free fluid in the pelvis may be physiologic.  Bladder wall is not thickened.  No loculated fluid collections.  The appendix is normal.  No evidence of diverticulitis.  Mild degenerative changes in the lumbar spine.  Scarring in the anterior abdominal wall is likely old postsurgical change.  IMPRESSION: Postoperative changes of prior gastric bypass with regional thickening of the jejunal roux loop and distal jejunum most likely representing enteritis.  Focal ischemia not entirely excluded. Mesenteric lymph nodes are prominent but not pathologically enlarged, likely representing reactive nodes.  Diffuse enlargement of the uterus consistent with fibroid uterus.   Original Report Authenticated By: Marlon Pel, M.D.    Dg Hysterogram (hsg)  09/27/2011  *RADIOLOGY REPORT*  Clinical Data:  History of left ectopic, fibroidectomy, miscarriage  HYSTEROSALPINGOGRAM  Technique:  Following cleansing of the  cervix and vagina with Betadine solution, a hysterosalpingogram was performed using a 5- [place] hysterosalpingogram catheter and Omnipaque 300 contrast. The patient tolerated the examination without difficulty.  Fluoroscopy time: 0.8 minutes.  Findings: The intrauterine cavity has a normal contour and appearance with no fixed filling defects to suggest submucosal fibroids or polyps.  The right fallopian tube has a normal appearance, and there is free spill of contrast into the right  adnexal region.  The left fallopian tube terminates at its fimbrial portion, consistent with history of prior left pelvic ectopic pregnancy with left oophorectomy.  IMPRESSION: Normal appearing endometrial cavity and patent right fallopian tube.  Non patent left fallopian tube.  Original Report Authenticated By: Brandon Melnick, M.D.   Dg Abd Acute W/chest  10/09/2011  *RADIOLOGY REPORT*  Clinical Data: Mid abdominal and back pain for 6 hours with nausea and vomiting.  ACUTE ABDOMEN SERIES (ABDOMEN 2 VIEW & CHEST 1 VIEW)  Comparison: Plain film chest 02/26/2011  Findings: Frontal view of the chest demonstrates midline trachea. Normal heart size and mediastinal contours. No pleural effusion or pneumothorax.  Clear lungs.  Abominal films demonstrate no free intraperitoneal air or significant air fluid levels on upright positioning.  Surgical sutures in the left upper quadrant suspected.  No significant bowel dilatation on supine imaging.  Phleboliths in the pelvis.  Minimal distal gas. No abnormal abdominal calcifications.   No appendicolith.  IMPRESSION: No acute findings.  Original Report Authenticated By: Consuello Bossier, M.D.         No results found for this or any previous visit (from the past 240 hour(s)).  BRIEF ADMITTING H & P: 36 year old female who states that she accidentally had intake of a small amount of hydrogen peroxide. The patient states that she normally has a bottle of water on her bedside table. However on that evening she also had a bottle of peroxide on the bedside table. She woke during the night and reach for the bottle to take a drink and inadvertently picked up the bottle of hydrogen peroxide instead of the water. The patient states that she only took one mouthful and immediately recognized it with hydrogen peroxide. She then called the [facility] and was advised to drink water immediately. The patient presented to the emergency room after having hematemesis. She was  observed and discharged home. The patient continued to have nonbilious non-hematemesis emesis and came back to the emergency room where she was admitted for further evaluation and management  Hospital Course:  Present on Admission:  .Hematemesis: Based on the amount of hydrogen peroxide the patient describes taking, hematemesis of the magnitude that she describes is not unexpected response. I was concerned about possible Mallory-Weiss tear and thus Dr. Stacie Glaze was consult. He agreed with the above assessment but felt that the possible Mallory-Weiss tear had resolved. He recommended conservative treatment with small meals. The patient was started off with small amounts of liquids and then advanced in her diet which he tolerated well.   .Nausea & vomiting: The patient continued to have nausea and vomiting throughout her hospitalization. Gastroenterology felt that this was secondary to the ingestion of large boluses of oral intake. The patient was started off with frequent small clots of liquid and then advanced. At the time of discharge the patient was tolerating solid meals without difficulty.  . Abdominal pain: This is felt to be secondary to large boluses of oral intake. The patient was treated conservatively with frequent small aliquots of oral intake and  the abdominal pain resolved  .Hypokalemia: This was felt to be secondary to emesis. It was replaced orally at the time of discharge the patient was eukalemic.  Marland KitchenHypertension: Blood pressure is well-controlled during this hospitalization.   Disposition and Follow-up:  Patient's followup with her primary care physician as needed. Discharge Orders    Future Orders Please Complete By Expires   Diet general      Activity as tolerated - No restrictions         DISCHARGE EXAM:  General: Alert, awake, oriented x3, in no acute distress.  Vital Signs:Blood pressure 154/76, pulse 64, temperature 98 F (36.7 C), temperature source Oral, resp.  rate 18, height 5\' 6"  (1.676 m), weight 108.092 kg (238 lb 4.8 oz), last menstrual period 09/20/2011, SpO2 100.00%. OROPHARYNX: Moist, No exudate/ erythema/lesions.  Heart: Regular rate and rhythm.  Lungs: Clear to auscultation.  Abdomen: Soft, diffusely tender and obese, nondistended, positive bowel sounds, no masses no hepatosplenomegaly noted.  Neuro: No focal neurological deficits noted cranial nerves II through XII grossly intact.  Musculoskeletal: No warm swelling or erythema around joints, no spinal tenderness noted.  Psychiatric: Patient alert and oriented x3, good insight and cognition, good recent to remote recall.    Basename 10/14/11 0614 10/13/11 0630  NA 141 141  K 3.0* 3.1*  CL 101 102  CO2 33* 32  GLUCOSE 111* 96  BUN 6 4*  CREATININE 0.77 0.75  CALCIUM 8.9 8.7  MG -- --  PHOS -- --    Basename 10/13/11 0630  AST 14  ALT 9  ALKPHOS 75  BILITOT 0.2*  PROT 6.0  ALBUMIN 2.8*   No results found for this basename: LIPASE:2,AMYLASE:2 in the last 72 hours  Basename 10/13/11 0630 10/12/11 2109 10/12/11 0605  WBC 10.7* -- 15.8*  NEUTROABS 7.8* -- --  HGB 9.4* 9.2* --  HCT 30.6* 29.4* --  MCV 80.3 -- 79.6  PLT 252 -- 223   Total time for discharge process including face-to-face time examination and decision-making greater than 30 minutes Signed: Ahleah Simko A. 10/14/2011, 12:08 PM

## 2011-10-20 ENCOUNTER — Other Ambulatory Visit: Payer: Self-pay | Admitting: Family Medicine

## 2011-10-20 ENCOUNTER — Other Ambulatory Visit (HOSPITAL_COMMUNITY)
Admission: RE | Admit: 2011-10-20 | Discharge: 2011-10-20 | Disposition: A | Discharge status: Home / Self Care | Payer: 59 | Source: Ambulatory Visit | Attending: Family Medicine | Admitting: Family Medicine

## 2011-10-20 DIAGNOSIS — Z124 Encounter for screening for malignant neoplasm of cervix: Secondary | ICD-10-CM | POA: Insufficient documentation

## 2011-10-20 DIAGNOSIS — Z1151 Encounter for screening for human papillomavirus (HPV): Secondary | ICD-10-CM | POA: Insufficient documentation

## 2012-04-28 ENCOUNTER — Encounter (HOSPITAL_COMMUNITY): Payer: Self-pay | Admitting: *Deleted

## 2012-04-28 ENCOUNTER — Emergency Department (HOSPITAL_COMMUNITY)
Admission: EM | Admit: 2012-04-28 | Discharge: 2012-04-28 | Disposition: A | Discharge status: Home / Self Care | Payer: 59 | Attending: Emergency Medicine | Admitting: Emergency Medicine

## 2012-04-28 DIAGNOSIS — S3981XA Other specified injuries of abdomen, initial encounter: Secondary | ICD-10-CM | POA: Insufficient documentation

## 2012-04-28 DIAGNOSIS — IMO0002 Reserved for concepts with insufficient information to code with codable children: Secondary | ICD-10-CM | POA: Insufficient documentation

## 2012-04-28 DIAGNOSIS — Y9329 Activity, other involving ice and snow: Secondary | ICD-10-CM | POA: Insufficient documentation

## 2012-04-28 DIAGNOSIS — T148XXA Other injury of unspecified body region, initial encounter: Secondary | ICD-10-CM

## 2012-04-28 DIAGNOSIS — S8990XA Unspecified injury of unspecified lower leg, initial encounter: Secondary | ICD-10-CM | POA: Insufficient documentation

## 2012-04-28 DIAGNOSIS — S39848A Other specified injuries of external genitals, initial encounter: Secondary | ICD-10-CM | POA: Insufficient documentation

## 2012-04-28 DIAGNOSIS — S3994XA Unspecified injury of external genitals, initial encounter: Secondary | ICD-10-CM | POA: Insufficient documentation

## 2012-04-28 DIAGNOSIS — O169 Unspecified maternal hypertension, unspecified trimester: Secondary | ICD-10-CM | POA: Insufficient documentation

## 2012-04-28 DIAGNOSIS — S99929A Unspecified injury of unspecified foot, initial encounter: Secondary | ICD-10-CM | POA: Insufficient documentation

## 2012-04-28 DIAGNOSIS — W010XXA Fall on same level from slipping, tripping and stumbling without subsequent striking against object, initial encounter: Secondary | ICD-10-CM | POA: Insufficient documentation

## 2012-04-28 DIAGNOSIS — W19XXXA Unspecified fall, initial encounter: Secondary | ICD-10-CM

## 2012-04-28 DIAGNOSIS — Y9289 Other specified places as the place of occurrence of the external cause: Secondary | ICD-10-CM | POA: Insufficient documentation

## 2012-04-28 DIAGNOSIS — Z8639 Personal history of other endocrine, nutritional and metabolic disease: Secondary | ICD-10-CM | POA: Insufficient documentation

## 2012-04-28 DIAGNOSIS — O9989 Other specified diseases and conditions complicating pregnancy, childbirth and the puerperium: Secondary | ICD-10-CM | POA: Insufficient documentation

## 2012-04-28 DIAGNOSIS — Z862 Personal history of diseases of the blood and blood-forming organs and certain disorders involving the immune mechanism: Secondary | ICD-10-CM | POA: Insufficient documentation

## 2012-04-28 DIAGNOSIS — Z3490 Encounter for supervision of normal pregnancy, unspecified, unspecified trimester: Secondary | ICD-10-CM

## 2012-04-28 DIAGNOSIS — Z8742 Personal history of other diseases of the female genital tract: Secondary | ICD-10-CM | POA: Insufficient documentation

## 2012-04-28 MED ORDER — OXYCODONE-ACETAMINOPHEN 5-325 MG PO TABS
1.0000 | ORAL_TABLET | Freq: Once | ORAL | Status: AC
  Administered 2012-04-28: 1 via ORAL
  Filled 2012-04-28: qty 1

## 2012-04-28 MED ORDER — HYDROCODONE-ACETAMINOPHEN 5-325 MG PO TABS: 1.0000 | ORAL_TABLET | Freq: Four times a day (QID) | ORAL | Status: DC | PRN

## 2012-04-28 NOTE — ED Provider Notes (Addendum)
History     CSN: [id number]  Arrival date & time 04/28/12  0108   First MD Initiated Contact with Patient 04/28/12 [phone]      Chief Complaint  Patient presents with  . Fall  . Vaginal Pain  . Back Pain  . Leg Pain    (Consider location/radiation/quality/duration/timing/severity/associated sxs/prior treatment) HPI Comments: 37 y/o G4P1 woman about [redacted] weeks pregnant comes in with cc of fall. Pt had a fall around 9 pm, she slid over the ice, and fell onto her back. Few minutes later she started having some abd pain, lower quadrants, and in her perineal area. The pain is sharp, on radiating. There is no vaginal bleeding, discharge. The pain is sharp, not crampy, and not labor type. She has no other med problems, not on any anticoagulants.   Patient is a 37 y.o. female presenting with fall, vaginal pain, back pain, and leg pain. The history is provided by the patient.  Fall Associated symptoms include abdominal pain. Pertinent negatives include no nausea, no vomiting and no hematuria.  Vaginal Pain Associated symptoms include abdominal pain. Pertinent negatives include no chest pain and no shortness of breath.  Back Pain Associated symptoms: abdominal pain and leg pain   Associated symptoms: no chest pain   Leg Pain Associated symptoms: back pain   Associated symptoms: no neck pain     Past Medical History  Diagnosis Date  . Obesity   . Fibroids   . Hypertension     Past Surgical History  Procedure Laterality Date  . Gastric bypass  2010    at [facility]  . Oophorectomy    . Myomectomy      Family History  Problem Relation Age of Onset  . Cancer      grandfather had pancreatic cancer    History  Substance Use Topics  . Smoking status: Never Smoker   . Smokeless tobacco: Never Used  . Alcohol Use: No    OB History   Grav Para Term Preterm Abortions TAB SAB Ect Mult Living   4 1 1  2     1       Review of Systems  Constitutional: Negative for activity change.   HENT: Negative for facial swelling and neck pain.   Respiratory: Negative for cough, shortness of breath and wheezing.   Cardiovascular: Negative for chest pain.  Gastrointestinal: Positive for abdominal pain. Negative for nausea, vomiting, diarrhea, constipation, blood in stool and abdominal distention.  Genitourinary: Positive for vaginal pain. Negative for hematuria and difficulty urinating.  Musculoskeletal: Positive for back pain.  Skin: Negative for color change.  Neurological: Negative for speech difficulty.  Hematological: Does not bruise/bleed easily.  Psychiatric/Behavioral: Negative for confusion.    Allergies  Morphine and related and Tetracyclines & related  Home Medications   Current Outpatient Rx  Name  Route  Sig  Dispense  Refill  . ferrous sulfate 325 (65 FE) MG tablet   Oral   Take 325 mg by mouth daily with breakfast.         . Prenatal Vit-Fe Fumarate-FA (PRENATAL MULTIVITAMIN) TABS   Oral   Take 1 tablet by mouth daily at 12 noon.           BP 149/84  Pulse 77  Temp(Src) 97.6 F (36.4 C) (Oral)  Resp 20  SpO2 99%  LMP 09/20/2011  Physical Exam  Nursing note and vitals reviewed. Constitutional: She is oriented to person, place, and time. She appears well-developed and well-nourished.  HENT:  Head: Normocephalic and atraumatic.  Eyes: EOM are normal. Pupils are equal, round, and reactive to light.  Neck: Neck supple.  Cardiovascular: Normal rate, regular rhythm and normal heart sounds.   No murmur heard. Pulmonary/Chest: Effort normal. No respiratory distress.  Abdominal: Soft. She exhibits no distension. There is tenderness. There is no rebound and no guarding.  Neurological: She is alert and oriented to person, place, and time.  Skin: Skin is warm and dry.    ED Course  [place] bedside Date/Time: 04/28/2012 5:27 AM Performed by: Derwood Kaplan Authorized by: Derwood Kaplan Consent: Verbal consent obtained. Risks and benefits: risks,  benefits and alternatives were discussed Consent given by: patient Comments: FAST - negative   (including critical care time)  Labs Reviewed - No data to display No results found.   No diagnosis found.    MDM  Pt comes in post fall. She has diffuse, lower abdominal tenderness, no rebound or guarding. We will get a bedside FAST. [facility] already [facility] bedside, and the tracing so far are reassuring. Dr. Despina Hidden recommends monitoring for 2 hours.  We will continue to monitor. No indications for pelvic exam, or labs at this time. No rhogham,  Derwood Kaplan, MD 04/28/12 0433  5:26 AM Bedside FAST is negative. She has some back pain, no neuro deficits. Given her pregnancy status, and no real change in management in an event of fracture of the back - we are deferring imaging for now. She has been advised to go to wom[facility] clinic if her pain gets worse or there is any vaginal bleeding.   Derwood Kaplan, MD 04/28/12 [phone]

## 2012-04-28 NOTE — ED Notes (Addendum)
OB rapid response RN contacted, "will see", coming from [facility].  EDP Dr. Rhunette Croft, made aware.

## 2012-04-28 NOTE — ED Notes (Signed)
Pt comfortable with discharge instructions at this time. Pt states back understanding of need to wait in waiting room until 0930 r/t taking Percocet at 0330.

## 2012-04-28 NOTE — Progress Notes (Addendum)
Fall today at 2100, slipped off curb. Seen at [facility] for high risk- fibroids and infertility. Left oophorectomy.

## 2012-04-28 NOTE — ED Notes (Signed)
Pt reports taking Percocet in the past and states it does not cause allergic reaction.

## 2012-04-28 NOTE — ED Notes (Addendum)
Slipped on ice and fell, fell around 2100, c/o pain in low back, vagina and BLE. Fell back wards onto back. Reports some dizziness and nausea. Reports recent h/o some discharge. "felt a little wet, but am not sure if it was leakage or the normal d/c". (Denies: bleeding, vd, constipation), "have felt baby move earlier today, no movement since fall". OBGYN is at [facility]. [redacted] weeks pregnant, last OB visit was Tuesday.

## 2012-08-28 ENCOUNTER — Encounter (HOSPITAL_COMMUNITY): Payer: Self-pay | Admitting: *Deleted

## 2012-08-28 ENCOUNTER — Telehealth (HOSPITAL_COMMUNITY): Payer: Self-pay | Admitting: *Deleted

## 2012-08-28 NOTE — Telephone Encounter (Signed)
Resolve episode 

## 2013-12-23 ENCOUNTER — Encounter (HOSPITAL_COMMUNITY): Payer: Self-pay | Admitting: *Deleted

## 2014-09-09 ENCOUNTER — Other Ambulatory Visit: Payer: Self-pay | Admitting: Family Medicine

## 2014-09-09 ENCOUNTER — Other Ambulatory Visit (HOSPITAL_COMMUNITY)
Admission: RE | Admit: 2014-09-09 | Discharge: 2014-09-09 | Disposition: A | Discharge status: Home / Self Care | Payer: 59 | Source: Ambulatory Visit | Attending: Family Medicine | Admitting: Family Medicine

## 2014-09-09 DIAGNOSIS — R87810 Cervical high risk human papillomavirus (HPV) DNA test positive: Secondary | ICD-10-CM | POA: Insufficient documentation

## 2014-09-09 DIAGNOSIS — Z1151 Encounter for screening for human papillomavirus (HPV): Secondary | ICD-10-CM | POA: Insufficient documentation

## 2014-09-09 DIAGNOSIS — Z124 Encounter for screening for malignant neoplasm of cervix: Secondary | ICD-10-CM | POA: Diagnosis not present

## 2014-09-10 ENCOUNTER — Other Ambulatory Visit: Payer: Self-pay

## 2014-09-10 DIAGNOSIS — Z1231 Encounter for screening mammogram for malignant neoplasm of breast: Secondary | ICD-10-CM

## 2014-09-11 LAB — CYTOLOGY - PAP

## 2014-10-13 ENCOUNTER — Ambulatory Visit: Payer: Self-pay

## 2015-04-01 ENCOUNTER — Ambulatory Visit
Admission: RE | Admit: 2015-04-01 | Discharge: 2015-04-01 | Disposition: A | Discharge status: Home / Self Care | Payer: BC Managed Care – PPO | Source: Ambulatory Visit

## 2015-04-01 DIAGNOSIS — Z1231 Encounter for screening mammogram for malignant neoplasm of breast: Secondary | ICD-10-CM

## 2015-06-10 DIAGNOSIS — F419 Anxiety disorder, unspecified: Secondary | ICD-10-CM | POA: Diagnosis not present

## 2015-06-10 DIAGNOSIS — M25512 Pain in left shoulder: Secondary | ICD-10-CM | POA: Diagnosis not present

## 2015-10-16 ENCOUNTER — Other Ambulatory Visit: Payer: Self-pay | Admitting: Family Medicine

## 2015-10-16 ENCOUNTER — Other Ambulatory Visit (HOSPITAL_COMMUNITY)
Admission: RE | Admit: 2015-10-16 | Discharge: 2015-10-16 | Disposition: A | Discharge status: Home / Self Care | Payer: BLUE CROSS/BLUE SHIELD | Source: Ambulatory Visit | Attending: Family Medicine | Admitting: Family Medicine

## 2015-10-16 DIAGNOSIS — Z1151 Encounter for screening for human papillomavirus (HPV): Secondary | ICD-10-CM | POA: Diagnosis not present

## 2015-10-16 DIAGNOSIS — Z01419 Encounter for gynecological examination (general) (routine) without abnormal findings: Secondary | ICD-10-CM | POA: Diagnosis present

## 2015-10-19 LAB — CYTOLOGY - PAP

## 2015-12-31 DIAGNOSIS — K645 Perianal venous thrombosis: Secondary | ICD-10-CM | POA: Diagnosis not present

## 2016-01-01 DIAGNOSIS — K645 Perianal venous thrombosis: Secondary | ICD-10-CM | POA: Diagnosis not present

## 2016-03-15 DIAGNOSIS — J069 Acute upper respiratory infection, unspecified: Secondary | ICD-10-CM | POA: Diagnosis not present

## 2016-03-15 DIAGNOSIS — R05 Cough: Secondary | ICD-10-CM | POA: Diagnosis not present

## 2016-03-15 DIAGNOSIS — M545 Low back pain: Secondary | ICD-10-CM | POA: Diagnosis not present

## 2016-07-21 DIAGNOSIS — M5442 Lumbago with sciatica, left side: Secondary | ICD-10-CM | POA: Diagnosis not present

## 2016-07-21 DIAGNOSIS — R21 Rash and other nonspecific skin eruption: Secondary | ICD-10-CM | POA: Diagnosis not present

## 2016-08-01 DIAGNOSIS — M5136 Other intervertebral disc degeneration, lumbar region: Secondary | ICD-10-CM | POA: Diagnosis not present

## 2016-10-17 DIAGNOSIS — F419 Anxiety disorder, unspecified: Secondary | ICD-10-CM | POA: Diagnosis not present

## 2016-10-17 DIAGNOSIS — M25512 Pain in left shoulder: Secondary | ICD-10-CM | POA: Diagnosis not present

## 2016-10-17 DIAGNOSIS — E559 Vitamin D deficiency, unspecified: Secondary | ICD-10-CM | POA: Diagnosis not present

## 2016-10-17 DIAGNOSIS — I10 Essential (primary) hypertension: Secondary | ICD-10-CM | POA: Diagnosis not present

## 2016-10-17 DIAGNOSIS — G43109 Migraine with aura, not intractable, without status migrainosus: Secondary | ICD-10-CM | POA: Diagnosis not present

## 2017-01-19 DIAGNOSIS — K644 Residual hemorrhoidal skin tags: Secondary | ICD-10-CM | POA: Diagnosis not present

## 2017-04-10 DIAGNOSIS — M792 Neuralgia and neuritis, unspecified: Secondary | ICD-10-CM | POA: Diagnosis not present

## 2017-04-24 ENCOUNTER — Other Ambulatory Visit: Payer: Self-pay | Admitting: Family Medicine

## 2017-04-24 DIAGNOSIS — M5442 Lumbago with sciatica, left side: Secondary | ICD-10-CM | POA: Diagnosis not present

## 2017-04-24 DIAGNOSIS — Z1231 Encounter for screening mammogram for malignant neoplasm of breast: Secondary | ICD-10-CM

## 2017-04-30 ENCOUNTER — Observation Stay (HOSPITAL_COMMUNITY)
Admission: EM | Admit: 2017-04-30 | Discharge: 2017-05-01 | Disposition: A | Discharge status: Home / Self Care | Payer: BLUE CROSS/BLUE SHIELD | Attending: Internal Medicine | Admitting: Internal Medicine

## 2017-04-30 ENCOUNTER — Emergency Department (HOSPITAL_COMMUNITY): Payer: BLUE CROSS/BLUE SHIELD

## 2017-04-30 DIAGNOSIS — F419 Anxiety disorder, unspecified: Secondary | ICD-10-CM | POA: Insufficient documentation

## 2017-04-30 DIAGNOSIS — R29898 Other symptoms and signs involving the musculoskeletal system: Secondary | ICD-10-CM | POA: Diagnosis not present

## 2017-04-30 DIAGNOSIS — R0602 Shortness of breath: Secondary | ICD-10-CM | POA: Diagnosis not present

## 2017-04-30 DIAGNOSIS — Z885 Allergy status to narcotic agent status: Secondary | ICD-10-CM | POA: Diagnosis not present

## 2017-04-30 DIAGNOSIS — R5383 Other fatigue: Secondary | ICD-10-CM | POA: Diagnosis not present

## 2017-04-30 DIAGNOSIS — D649 Anemia, unspecified: Secondary | ICD-10-CM | POA: Diagnosis not present

## 2017-04-30 DIAGNOSIS — Z79899 Other long term (current) drug therapy: Secondary | ICD-10-CM | POA: Insufficient documentation

## 2017-04-30 DIAGNOSIS — F32A Depression, unspecified: Secondary | ICD-10-CM | POA: Diagnosis present

## 2017-04-30 DIAGNOSIS — D509 Iron deficiency anemia, unspecified: Secondary | ICD-10-CM | POA: Diagnosis not present

## 2017-04-30 DIAGNOSIS — I10 Essential (primary) hypertension: Secondary | ICD-10-CM | POA: Diagnosis not present

## 2017-04-30 DIAGNOSIS — F329 Major depressive disorder, single episode, unspecified: Secondary | ICD-10-CM | POA: Insufficient documentation

## 2017-04-30 DIAGNOSIS — Z6836 Body mass index (BMI) 36.0-36.9, adult: Secondary | ICD-10-CM | POA: Insufficient documentation

## 2017-04-30 DIAGNOSIS — Z9884 Bariatric surgery status: Secondary | ICD-10-CM | POA: Insufficient documentation

## 2017-04-30 DIAGNOSIS — R079 Chest pain, unspecified: Secondary | ICD-10-CM | POA: Diagnosis present

## 2017-04-30 DIAGNOSIS — R531 Weakness: Secondary | ICD-10-CM | POA: Diagnosis not present

## 2017-04-30 DIAGNOSIS — Z881 Allergy status to other antibiotic agents status: Secondary | ICD-10-CM | POA: Insufficient documentation

## 2017-04-30 LAB — URINALYSIS, ROUTINE W REFLEX MICROSCOPIC
Bilirubin Urine: NEGATIVE
Glucose, UA: NEGATIVE mg/dL
Hgb urine dipstick: NEGATIVE
KETONES UR: NEGATIVE mg/dL
LEUKOCYTES UA: NEGATIVE
NITRITE: NEGATIVE
PH: 5 (ref 5.0–8.0)
Protein, ur: NEGATIVE mg/dL
SPECIFIC GRAVITY, URINE: 1.024 (ref 1.005–1.030)

## 2017-04-30 LAB — COMPREHENSIVE METABOLIC PANEL
ALBUMIN: 3.6 g/dL (ref 3.5–5.0)
ALT: 14 U/L (ref 14–54)
ANION GAP: 8 (ref 5–15)
AST: 17 U/L (ref 15–41)
Alkaline Phosphatase: 52 U/L (ref 38–126)
BILIRUBIN TOTAL: 0.5 mg/dL (ref 0.3–1.2)
BUN: 17 mg/dL (ref 6–20)
CHLORIDE: 104 mmol/L (ref 101–111)
CO2: 23 mmol/L (ref 22–32)
Calcium: 8.7 mg/dL — ABNORMAL LOW (ref 8.9–10.3)
Creatinine, Ser: 0.92 mg/dL (ref 0.44–1.00)
GFR calc Af Amer: >60 mL/min (ref >60)
GFR calc non Af Amer: >60 mL/min (ref >60)
GLUCOSE: 99 mg/dL (ref 65–99)
POTASSIUM: 3.5 mmol/L (ref 3.5–5.1)
SODIUM: 135 mmol/L (ref 135–145)
TOTAL PROTEIN: 6.7 g/dL (ref 6.5–8.1)

## 2017-04-30 LAB — RETICULOCYTES
RBC.: 3.89 MIL/uL (ref 3.87–5.11)
RETIC CT PCT: 1.4 % (ref 0.4–3.1)
Retic Count, Absolute: 54.5 10*3/uL (ref 19.0–186.0)

## 2017-04-30 LAB — I-STAT TROPONIN, ED: TROPONIN I, POC: 0 ng/mL (ref 0.00–0.08)

## 2017-04-30 LAB — CBC WITH DIFFERENTIAL/PLATELET
Basophils Absolute: 0 10*3/uL (ref 0.0–0.1)
Basophils Relative: 0 %
EOS ABS: 0.1 10*3/uL (ref 0.0–0.7)
Eosinophils Relative: 1 %
HEMATOCRIT: 27.7 % — AB (ref 36.0–46.0)
HEMOGLOBIN: 7.1 g/dL — AB (ref 12.0–15.0)
LYMPHS PCT: 23 %
Lymphs Abs: 1.7 10*3/uL (ref 0.7–4.0)
MCH: 16.9 pg — ABNORMAL LOW (ref 26.0–34.0)
MCHC: 25.6 g/dL — ABNORMAL LOW (ref 30.0–36.0)
MCV: 66.1 fL — ABNORMAL LOW (ref 78.0–100.0)
MONOS PCT: 4 %
Monocytes Absolute: 0.3 10*3/uL (ref 0.1–1.0)
NEUTROS PCT: 72 %
Neutro Abs: 5.2 10*3/uL (ref 1.7–7.7)
Platelets: 418 10*3/uL — ABNORMAL HIGH (ref 150–400)
RBC: 4.19 MIL/uL (ref 3.87–5.11)
RDW: 21.9 % — ABNORMAL HIGH (ref 11.5–15.5)
WBC: 7.3 10*3/uL (ref 4.0–10.5)

## 2017-04-30 LAB — POC OCCULT BLOOD, ED: Fecal Occult Bld: NEGATIVE

## 2017-04-30 LAB — CK: Total CK: 48 U/L (ref 38–234)

## 2017-04-30 LAB — IRON AND TIBC
Iron: 10 ug/dL — ABNORMAL LOW (ref 28–170)
SATURATION RATIOS: 2 % — AB (ref 10.4–31.8)
TIBC: 433 ug/dL (ref 250–450)
UIBC: 423 ug/dL

## 2017-04-30 LAB — VITAMIN B12: Vitamin B-12: 840 pg/mL (ref 180–914)

## 2017-04-30 LAB — I-STAT BETA HCG BLOOD, ED (MC, WL, AP ONLY)

## 2017-04-30 LAB — I-STAT CG4 LACTIC ACID, ED
LACTIC ACID, VENOUS: 0.82 mmol/L (ref 0.5–1.9)
LACTIC ACID, VENOUS: 0.82 mmol/L (ref 0.5–1.9)

## 2017-04-30 LAB — TROPONIN I: Troponin I: <0.03 ng/mL (ref <0.03)

## 2017-04-30 LAB — PREPARE RBC (CROSSMATCH)

## 2017-04-30 LAB — FOLATE: Folate: 18.4 ng/mL (ref >5.9)

## 2017-04-30 LAB — FERRITIN: FERRITIN: 3 ng/mL — AB (ref 11–307)

## 2017-04-30 MED ORDER — LISINOPRIL 10 MG PO TABS
10.0000 mg | ORAL_TABLET | Freq: Every day | ORAL | Status: DC
  Administered 2017-05-01: 10 mg via ORAL
  Filled 2017-04-30: qty 1

## 2017-04-30 MED ORDER — SODIUM CHLORIDE 0.9 % IV SOLN: Freq: Once | INTRAVENOUS | Status: DC

## 2017-04-30 MED ORDER — METHOCARBAMOL 500 MG PO TABS: 500.0000 mg | ORAL_TABLET | Freq: Three times a day (TID) | ORAL | Status: DC | PRN

## 2017-04-30 MED ORDER — HYDRALAZINE HCL 20 MG/ML IJ SOLN: 5.0000 mg | INTRAMUSCULAR | Status: DC | PRN

## 2017-04-30 MED ORDER — SODIUM CHLORIDE 0.9 % IV SOLN
Freq: Once | INTRAVENOUS | Status: AC
  Administered 2017-04-30: 21:00:00 via INTRAVENOUS

## 2017-04-30 MED ORDER — SODIUM CHLORIDE 0.9 % IV SOLN
INTRAVENOUS | Status: DC
  Administered 2017-04-30 – 2017-05-01 (×2): via INTRAVENOUS

## 2017-04-30 MED ORDER — ALPRAZOLAM 0.25 MG PO TABS: 0.2500 mg | ORAL_TABLET | Freq: Two times a day (BID) | ORAL | Status: DC | PRN

## 2017-04-30 MED ORDER — LISINOPRIL-HYDROCHLOROTHIAZIDE 10-12.5 MG PO TABS: 1.0000 | ORAL_TABLET | Freq: Every day | ORAL | Status: DC

## 2017-04-30 MED ORDER — POLYETHYLENE GLYCOL 3350 17 G PO PACK: 17.0000 g | PACK | Freq: Every day | ORAL | Status: DC | PRN

## 2017-04-30 MED ORDER — ZOLPIDEM TARTRATE 5 MG PO TABS: 5.0000 mg | ORAL_TABLET | Freq: Every evening | ORAL | Status: DC | PRN

## 2017-04-30 MED ORDER — SODIUM CHLORIDE 0.9 % IV BOLUS (SEPSIS)
1000.0000 mL | Freq: Once | INTRAVENOUS | Status: AC
  Administered 2017-04-30: 1000 mL via INTRAVENOUS

## 2017-04-30 MED ORDER — SERTRALINE HCL 50 MG PO TABS
50.0000 mg | ORAL_TABLET | Freq: Every day | ORAL | Status: DC
  Administered 2017-05-01: 50 mg via ORAL
  Filled 2017-04-30: qty 1
  Filled 2017-04-30 (×2): qty 2

## 2017-04-30 MED ORDER — ACETAMINOPHEN 325 MG PO TABS
650.0000 mg | ORAL_TABLET | Freq: Four times a day (QID) | ORAL | Status: DC | PRN
  Administered 2017-05-01: 650 mg via ORAL
  Filled 2017-04-30: qty 2

## 2017-04-30 MED ORDER — ONDANSETRON HCL 4 MG/2ML IJ SOLN
4.0000 mg | Freq: Three times a day (TID) | INTRAMUSCULAR | Status: DC | PRN
  Administered 2017-05-01: 4 mg via INTRAVENOUS
  Filled 2017-04-30: qty 2

## 2017-04-30 MED ORDER — HYDROCODONE-ACETAMINOPHEN 5-325 MG PO TABS: 1.0000 | ORAL_TABLET | Freq: Four times a day (QID) | ORAL | Status: DC | PRN

## 2017-04-30 NOTE — ED Notes (Signed)
PT now adding on that she sometimes has centralized chest pain non radiating that comes and goes. Please see original triage note for original complaint.

## 2017-04-30 NOTE — ED Provider Notes (Signed)
Emergency Department Provider Note   I have reviewed the triage vital signs and the nursing notes.   HISTORY  Chief Complaint No chief complaint on file.   HPI Heather Henson is a 42 y.o. female medical problems as diagnosed below the presents to the emergency department today secondary to weakness.  Patient states that she started Topamax and sertraline on Monday and then Tuesday or Wednesday she started to feel more weak and the next few days having worsening dyspnea on exertion, weakness, lightheadedness and chest pain with exertion.  She stopped her medication on Friday but his symptoms have continued.  She has not noticed any dark stools, red stools or vaginal bleeding.  She is been vomiting blood but has had a decreased appetite and weight loss during this time as well.  She has had some abdominal cramping.  Never had any symptoms like this before.  Does sometimes feel she is in a pass out.  Recently traveled to [facility] for business reasons on an airplane but has not noticed any lower extremity swelling.  She does have some pain in both legs. No other associated or modifying symptoms.    Past Medical History:  Diagnosis Date  . Fibroids   . Hypertension   . Obesity     Patient Active Problem List   Diagnosis Date Noted  . Symptomatic anemia 04/30/2017  . Anxiety 04/30/2017  . Chest pain 04/30/2017  . Tightness of neck 04/30/2017  . Microcytic anemia 04/30/2017  . Hypertension 10/14/2011  . Hematemesis 10/12/2011  . Nausea & vomiting 10/11/2011  . Hypokalemia 10/11/2011  . Abdominal pain 10/11/2011    Past Surgical History:  Procedure Laterality Date  . GASTRIC BYPASS  2010   at [facility]  . MYOMECTOMY    . OOPHORECTOMY      Current Outpatient Rx  . Order #: [id number] Class: Historical Med  . Order #: [id number] Class: Historical Med  . Order #: [id number] Class: Historical Med  . Order #: [id number] Class: Historical Med  . Order #: [id number] Class: Print     Allergies Morphine and related and Tetracyclines & related  Family History  Problem Relation Age of Onset  . Cancer Unknown        grandfather had pancreatic cancer    Social History Social History   Tobacco Use  . Smoking status: Never Smoker  . Smokeless tobacco: Never Used  Substance Use Topics  . Alcohol use: No  . Drug use: No    Review of Systems  All other systems negative except as documented in the HPI. All pertinent positives and negatives as reviewed in the HPI. ____________________________________________   PHYSICAL EXAM:  VITAL SIGNS: ED Triage Vitals  Enc Vitals Group     BP 04/30/17 1624 115/76     Pulse Rate 04/30/17 1624 99     Resp 04/30/17 1624 18     Temp 04/30/17 1624 98.4 F (36.9 C)     Temp Source 04/30/17 1624 Oral     SpO2 04/30/17 1624 100 %     Weight 04/30/17 1624 220 lb (99.8 kg)     Height 04/30/17 1624 5\' 6"  (1.676 m)    Constitutional: Alert and oriented. Well appearing and in no acute distress. Eyes: Conjunctivae are normal. PERRL. EOMI. Head: Atraumatic. Nose: No congestion/rhinnorhea. Mouth/Throat: Mucous membranes are moist.  Oropharynx non-erythematous. Neck: No stridor.  No meningeal signs.   Cardiovascular: Normal rate, regular rhythm. Good peripheral circulation. Grossly normal heart sounds.   Respiratory: Normal respiratory effort.  No retractions. Lungs CTAB. Gastrointestinal: Soft and nontender. No distention.  Musculoskeletal: No lower extremity tenderness nor edema. No gross deformities of extremities. Neurologic:  Normal speech and language. No gross focal neurologic deficits are appreciated.  Skin:  Skin is warm, dry and intact. No rash noted.   ____________________________________________   LABS (all labs ordered are listed, but only abnormal results are displayed)  Labs Reviewed  COMPREHENSIVE METABOLIC PANEL - Abnormal; Notable for the following components:      Result Value   Calcium 8.7 (*)     All other components within normal limits  CBC WITH DIFFERENTIAL/PLATELET - Abnormal; Notable for the following components:   Hemoglobin 7.1 (*)    HCT 27.7 (*)    MCV 66.1 (*)    MCH 16.9 (*)    MCHC 25.6 (*)    RDW 21.9 (*)    Platelets 418 (*)    All other components within normal limits  URINALYSIS, ROUTINE W REFLEX MICROSCOPIC  RETICULOCYTES  OCCULT BLOOD X 1 CARD TO LAB, STOOL  VITAMIN B12  FOLATE  IRON AND TIBC  FERRITIN  CK  HEMOGLOBIN A1C  LIPID PANEL  TROPONIN I  TROPONIN I  TROPONIN I  HIV ANTIBODY (ROUTINE TESTING)  BASIC METABOLIC PANEL  CBC  I-STAT CG4 LACTIC ACID, ED  I-STAT BETA HCG BLOOD, ED ([facility], [facility], [facility] ONLY)  I-STAT TROPONIN, ED  I-STAT CG4 LACTIC ACID, ED  POC OCCULT BLOOD, ED  PREPARE RBC (CROSSMATCH)  TYPE AND SCREEN  ABO/RH   ____________________________________________  EKG   EKG Interpretation  Date/Time:  Sunday April 30 2017 16:44:50 EDT Ventricular Rate:  99 PR Interval:  152 QRS Duration: 74 QT Interval:  358 QTC Calculation: 459 R Axis:   32 Text Interpretation:  Normal sinus rhythm Nonspecific T wave abnormality Abnormal ECG No old tracing to compare Confirmed by Merrily Pew [phone]) on 04/30/2017 9:40:35 PM      ____________________________________________  RADIOLOGY  Dg Chest 2 View  Result Date: 04/30/2017 CLINICAL DATA:  Chest pain and shortness of breath EXAM: CHEST - 2 VIEW COMPARISON:  February 25, 2010 FINDINGS: The heart size and mediastinal contours are within normal limits. Both lungs are clear. The visualized skeletal structures are unremarkable. IMPRESSION: No active cardiopulmonary disease. Electronically Signed   By: Dorise Bullion III M.D   On: 04/30/2017 18:05    ____________________________________________   PROCEDURES  Procedure(s) performed:   Procedures  CRITICAL CARE Performed by: Merrily Pew Total critical care time: 35 minutes Critical care time was exclusive of separately billable  procedures and treating other patients. Critical care was necessary to treat or prevent imminent or life-threatening deterioration. Critical care was time spent personally by me on the following activities: development of treatment plan with patient and/or surrogate as well as nursing, discussions with consultants, evaluation of patient's response to treatment, examination of patient, obtaining history from patient or surrogate, ordering and performing treatments and interventions, ordering and review of laboratory studies, ordering and review of radiographic studies, pulse oximetry and re-evaluation of patient's condition.  ____________________________________________   INITIAL IMPRESSION / ASSESSMENT AND PLAN / ED COURSE  Symptomatic anemia with borderline tachycardia. Unsure of etiology. Will type/screen/transfuse and add on anemia panel. Admit.      Pertinent labs & imaging results that were available during my care of the patient were reviewed by me and considered in my medical decision making (see chart for details).  ____________________________________________  FINAL CLINICAL IMPRESSION(S) / ED DIAGNOSES  Final diagnoses:  Symptomatic  anemia     MEDICATIONS GIVEN DURING THIS VISIT:  Medications  0.9 %  sodium chloride infusion (not administered)  0.9 %  sodium chloride infusion (not administered)  HYDROcodone-acetaminophen (NORCO/VICODIN) 5-325 MG per tablet 1 tablet (not administered)  ALPRAZolam (XANAX) tablet 0.25 mg (not administered)  sertraline (ZOLOFT) tablet 25-50 mg (not administered)  methocarbamol (ROBAXIN) tablet 500 mg (not administered)  hydrALAZINE (APRESOLINE) injection 5 mg (not administered)  ondansetron (ZOFRAN) injection 4 mg (not administered)  acetaminophen (TYLENOL) tablet 650 mg (not administered)  zolpidem (AMBIEN) tablet 5 mg (not administered)  lisinopril (PRINIVIL,ZESTRIL) tablet 10 mg (not administered)  sodium chloride 0.9 % bolus 1,000 mL  (not administered)  0.9 %  sodium chloride infusion (not administered)  polyethylene glycol (MIRALAX / GLYCOLAX) packet 17 g (not administered)     NEW OUTPATIENT MEDICATIONS STARTED DURING THIS VISIT:  New Prescriptions   No medications on file    Note:  This note was prepared with assistance of Dragon voice recognition software. Occasional wrong-word or sound-a-like substitutions may have occurred due to the inherent limitations of voice recognition software.   Merrily Pew, MD 04/30/17 2141

## 2017-04-30 NOTE — H&P (Signed)
History and Physical    Heather Henson JJK:[id number] DOB: August 23, 1975 DOA: 04/30/2017  Referring MD/NP/PA:   PCP: Marda Stalker, PA-C   Patient coming from:  The patient is coming from home.  At baseline, pt is independent for most of ADL.      Chief Complaint: Generalized weakness, lightheadedness, shortness of breath, intermittent chest pain, muscle tightness  HPI: Heather Henson is a 42 y.o. female with medical history significant of obesity, gastric bypass, hypertension and anxiety, fibroid (s/p of myomectomy, oophorectomy), migraine headaches, hematemesis, who presents with generalized weakness, lightheadedness, shortness of breath, intermittent chest pain, muscle tightness.  Pt states that she has been having generalized weakness, lightheadedness, mild shortness of breath for about 1 week, which has been progressively getting worse. She also has mild substernal intermittent chest pain, but she is chest pain free today. No cough, fever or chills. Denies nausea, vomiting, diarrhea, abdominal pain, hematemesis, hematuria, hematochezia. No vaginal bleeding. No symptoms of UTI. No unilateral weakness, numbness or tingling. Extremities. No vision change or hearing loss.  Pt state that she has migraine headaches. She was started with topiramate by primary care doctor on Monday. She developed muscle tightness in both hands, lower jaw, neck and upper back, and has mild pain in lower jaw, neck and upper back. She feels lethargic. No syncope. She stopped taking topiramate, but still has some muscle tightness.  ED Course: pt was found to have hemoglobin 7.1 which was 9.4 on 10/13/11, negative FOBT, negative urinalysis, negative pregnancy test, troponin negative, lactic acid 0.82, electrolytes renal function okay, temperature normal, heart rate in 90s, O2 100% on room air, negative chest x-ray. Patient is placed on telemetry bed for observation.   Review of Systems:   General: no fevers, chills, no  body weight gain, has poor appetite, has fatigue HEENT: no blurry vision, hearing changes or sore throat Respiratory: has dyspnea, no coughing, wheezing CV: has chest pain, no palpitations GI: no nausea, vomiting, abdominal pain, diarrhea, constipation GU: no dysuria, burning on urination, increased urinary frequency, hematuria  Ext: no leg edema Neuro: no unilateral weakness, numbness, or tingling, no vision change or hearing loss. Skin: no rash, no skin tear. MSK: No muscle spasm, no deformity, no limitation of range of movement in spin. Has muscle tightness. Heme: No easy bruising.  Travel history: No recent long distant travel.  Allergy:  Allergies  Allergen Reactions  . Morphine And Related Rash  . Tetracyclines & Related Rash    Past Medical History:  Diagnosis Date  . Fibroids   . Hypertension   . Obesity     Past Surgical History:  Procedure Laterality Date  . GASTRIC BYPASS  2010   at [facility]  . MYOMECTOMY    . OOPHORECTOMY      Social History:  reports that  has never smoked. she has never used smokeless tobacco. She reports that she does not drink alcohol or use drugs.  Family History:  Family History  Problem Relation Age of Onset  . Cancer Unknown        grandfather had pancreatic cancer     Prior to Admission medications   Medication Sig Start Date End Date Taking? Authorizing Provider  ALPRAZolam (XANAX) 0.25 MG tablet Take 0.25 mg by mouth 2 (two) times daily as needed. 04/25/17  Yes [provider]  lisinopril-hydrochlorothiazide (PRINZIDE,ZESTORETIC) 10-12.5 MG tablet Take 1 tablet by mouth daily. 04/14/17  Yes [provider]  sertraline (ZOLOFT) 50 MG tablet Take 25-50 mg by mouth daily.  04/24/17  Yes [provider]  topiramate (TOPAMAX) 50 MG tablet Take 50 mg by mouth 2 (two) times daily. 04/24/17  Yes [provider]  HYDROcodone-acetaminophen (NORCO/VICODIN) 5-325 MG per tablet Take 1 tablet by mouth every 6 (six)  hours as needed for pain. Patient not taking: Reported on 04/30/2017 04/28/12   Varney Biles, MD    Physical Exam: Vitals:   05/01/17 0300 05/01/17 0330 05/01/17 0400 05/01/17 0545  BP: 117/70 115/66 127/72 120/78  Pulse: 80 83 88 83  Resp:   16 (!) 8  Temp:      TempSrc:      SpO2: 99% 98% 100% 96%  Weight:      Height:       General: Not in acute distress. Pale looking. HEENT:       Eyes: PERRL, EOMI, no scleral icterus.       ENT: No discharge from the ears and nose, no pharynx injection, no tonsillar enlargement.        Neck: No JVD, no bruit, no mass felt. Heme: No neck lymph node enlargement. Cardiac: S1/S2, RRR, No murmurs, No gallops or rubs. Respiratory: No rales, wheezing, rhonchi or rubs. GI: Soft, nondistended, nontender, no rebound pain, no organomegaly, BS present. GU: No hematuria Ext: No pitting leg edema bilaterally. 2+DP/PT pulse bilaterally. Musculoskeletal: No joint deformities, No joint redness or warmth, no limitation of ROM in spin. Skin: No rashes.  Neuro: lethargic, but oriented X3, cranial nerves II-XII grossly intact, moves all extremities normally. Muscle strength 5/5 in all extremities, sensation to light touch intact.  Psych: Patient is not psychotic, no suicidal or hemocidal ideation.  Labs on Admission: I have personally reviewed following labs and imaging studies  CBC: Recent Labs  Lab 04/30/17 1651  WBC 7.3  NEUTROABS 5.2  HGB 7.1*  HCT 27.7*  MCV 66.1*  PLT 101*   Basic Metabolic Panel: Recent Labs  Lab 04/30/17 1651  NA 135  K 3.5  CL 104  CO2 23  GLUCOSE 99  BUN 17  CREATININE 0.92  CALCIUM 8.7*   GFR: Estimated Creatinine Clearance: 94 mL/min (by C-G formula based on SCr of 0.92 mg/dL). Liver Function Tests: Recent Labs  Lab 04/30/17 1651  AST 17  ALT 14  ALKPHOS 52  BILITOT 0.5  PROT 6.7  ALBUMIN 3.6   No results for input(s): LIPASE, AMYLASE in the last 168 hours. No results for input(s): AMMONIA in the  last 168 hours. Coagulation Profile: No results for input(s): INR, PROTIME in the last 168 hours. Cardiac Enzymes: Recent Labs  Lab 04/30/17 2144  CKTOTAL 29  TROPONINI <0.03   BNP (last 3 results) No results for input(s): PROBNP in the last 8760 hours. HbA1C: No results for input(s): HGBA1C in the last 72 hours. CBG: No results for input(s): GLUCAP in the last 168 hours. Lipid Profile: No results for input(s): CHOL, HDL, LDLCALC, TRIG, CHOLHDL, LDLDIRECT in the last 72 hours. Thyroid Function Tests: No results for input(s): TSH, T4TOTAL, FREET4, T3FREE, THYROIDAB in the last 72 hours. Anemia Panel: Recent Labs    04/30/17 2035  VITAMINB12 840  FOLATE 18.4  FERRITIN 3*  TIBC 433  IRON 10*  RETICCTPCT 1.4   Urine analysis:    Component Value Date/Time   COLORURINE YELLOW 04/30/2017 1638   APPEARANCEUR CLEAR 04/30/2017 1638   LABSPEC 1.024 04/30/2017 1638   PHURINE 5.0 04/30/2017 1638   GLUCOSEU NEGATIVE 04/30/2017 1638   HGBUR NEGATIVE 04/30/2017 1638   BILIRUBINUR NEGATIVE  04/30/2017 [place] 04/30/2017 1638   PROTEINUR NEGATIVE 04/30/2017 1638   UROBILINOGEN 1.0 10/12/2011 2110   NITRITE NEGATIVE 04/30/2017 1638   LEUKOCYTESUR NEGATIVE 04/30/2017 1638   Sepsis Labs: @LABRCNTIP (procalcitonin:4,lacticidven:4) )No results found for this or any previous visit (from the past 240 hour(s)).   Radiological Exams on Admission: Dg Chest 2 View  Result Date: 04/30/2017 CLINICAL DATA:  Chest pain and shortness of breath EXAM: CHEST - 2 VIEW COMPARISON:  February 25, 2010 FINDINGS: The heart size and mediastinal contours are within normal limits. Both lungs are clear. The visualized skeletal structures are unremarkable. IMPRESSION: No active cardiopulmonary disease. Electronically Signed   By: Dorise Bullion III M.D   On: 04/30/2017 18:05     EKG: Independently reviewed. Sinus rhythm, QTC 459, nonspecific T-wave change.    Assessment/Plan Principal  Problem:   Symptomatic anemia Active Problems:   Hypertension   Depression   Chest pain   Tightness of neck   Microcytic anemia   Lethargy   Symptomatic anemia and microcytic anemia: FOBT negative. Due to iron deficiency. Anemia panel showed iron deficiency pattern. Hemoglobin 7.1, which was 9.4 on 10/13/11. Hemodynamically stable.  -will place on tele bed for obs - IVF: 1L, then 100 cc/h -transfuse 2 unit of bleed -give one dose of IV Faraheme -start ferrous sulfate 325 mg 3 times a day -When necessary sennokot for constipation  CP: Likely due to demand ischemia secondary to anemia. Currently CP free -trop x 3 -check A1c, FLP  Hypertension:  -switch Prinzide due to lisinopril 10 mg daily due to need of IV fluid.  Depression: Stable, no suicidal or homicidal ideations. -Continue home medications: zoloft  Tightness of neck, hand and back: Etiology is not clear. Possibly due to topiramate side effects. Patient already stopped taking topiramate -Check CK -When necessary Robaxin  Lethargy: Etiology is not clear. Possibly due to anemia. -check ammonia since topiramate can cause elevation of ammonia level -Frequent neuro check   DVT ppx: SCD Code Status: Full code Family Communication:  Yes, patient's husband at bed side Disposition Plan:  Anticipate discharge back to previous home environment Consults called:  none Admission status: Obs / tele     Date of Service 05/01/2017    Ivor Costa [facility] Pager [phone]  If 7PM-7AM, please contact night-coverage [email or web address] Password [facility] 05/01/2017, 6:19 AM

## 2017-04-30 NOTE — ED Triage Notes (Addendum)
Pt states saw PCP Monday and was started on Topiramate and one other new med, then Tuesday had an episode where her hands were drawn tight, paramedics told her it was probably a panic attack. She stopped taking the meds on Friday. Pt states since Wednesday she has been weak and lethargic, and feels like she is going to faint when she stands. When she eats her jaw hurts bilaterally and into her neck. Pt thought it could've been the flu. Vitals stable. She also has pain to back of neck and upper back, pain with walking.

## 2017-05-01 DIAGNOSIS — D508 Other iron deficiency anemias: Secondary | ICD-10-CM | POA: Diagnosis not present

## 2017-05-01 DIAGNOSIS — D509 Iron deficiency anemia, unspecified: Secondary | ICD-10-CM | POA: Diagnosis not present

## 2017-05-01 DIAGNOSIS — I10 Essential (primary) hypertension: Secondary | ICD-10-CM | POA: Diagnosis not present

## 2017-05-01 DIAGNOSIS — D649 Anemia, unspecified: Secondary | ICD-10-CM | POA: Diagnosis not present

## 2017-05-01 LAB — BASIC METABOLIC PANEL
Anion gap: 6 (ref 5–15)
BUN: 11 mg/dL (ref 6–20)
CALCIUM: 8.2 mg/dL — AB (ref 8.9–10.3)
CO2: 23 mmol/L (ref 22–32)
CREATININE: 0.75 mg/dL (ref 0.44–1.00)
Chloride: 107 mmol/L (ref 101–111)
GFR calc Af Amer: >60 mL/min (ref >60)
Glucose, Bld: 93 mg/dL (ref 65–99)
POTASSIUM: 3.2 mmol/L — AB (ref 3.5–5.1)
SODIUM: 136 mmol/L (ref 135–145)

## 2017-05-01 LAB — LIPID PANEL
Cholesterol: 112 mg/dL (ref 0–200)
HDL: 36 mg/dL — ABNORMAL LOW (ref >40)
LDL CALC: 65 mg/dL (ref 0–99)
Total CHOL/HDL Ratio: 3.1 RATIO
Triglycerides: 55 mg/dL (ref <150)
VLDL: 11 mg/dL (ref 0–40)

## 2017-05-01 LAB — CBC
HEMATOCRIT: 28.7 % — AB (ref 36.0–46.0)
Hemoglobin: 8 g/dL — ABNORMAL LOW (ref 12.0–15.0)
MCH: 19.8 pg — ABNORMAL LOW (ref 26.0–34.0)
MCHC: 27.9 g/dL — ABNORMAL LOW (ref 30.0–36.0)
MCV: 70.9 fL — ABNORMAL LOW (ref 78.0–100.0)
Platelets: 314 10*3/uL (ref 150–400)
RBC: 4.05 MIL/uL (ref 3.87–5.11)
RDW: 25.1 % — AB (ref 11.5–15.5)
WBC: 6.7 10*3/uL (ref 4.0–10.5)

## 2017-05-01 LAB — TROPONIN I

## 2017-05-01 LAB — HEMOGLOBIN A1C
Hgb A1c MFr Bld: 5.3 % (ref 4.8–5.6)
MEAN PLASMA GLUCOSE: 105.41 mg/dL

## 2017-05-01 LAB — HIV ANTIBODY (ROUTINE TESTING W REFLEX): HIV SCREEN 4TH GENERATION: NONREACTIVE

## 2017-05-01 LAB — ABO/RH: ABO/RH(D): O POS

## 2017-05-01 LAB — AMMONIA: Ammonia: 30 umol/L (ref 9–35)

## 2017-05-01 MED ORDER — VITAMIN C 500 MG PO TABS
500.0000 mg | ORAL_TABLET | Freq: Every day | ORAL | Status: DC
  Filled 2017-05-01: qty 1

## 2017-05-01 MED ORDER — TAB-A-VITE/IRON PO TABS
1.0000 | ORAL_TABLET | Freq: Every day | ORAL | Status: DC
  Filled 2017-05-01: qty 1

## 2017-05-01 MED ORDER — ASCORBIC ACID 500 MG PO TABS: 500.0000 mg | ORAL_TABLET | Freq: Every day | ORAL | Status: AC

## 2017-05-01 MED ORDER — FERROUS SULFATE 325 (65 FE) MG PO TABS: 325.0000 mg | ORAL_TABLET | Freq: Three times a day (TID) | ORAL | Status: DC

## 2017-05-01 MED ORDER — SODIUM CHLORIDE 0.9 % IV SOLN
510.0000 mg | Freq: Once | INTRAVENOUS | Status: AC
  Administered 2017-05-01: 510 mg via INTRAVENOUS
  Filled 2017-05-01: qty 17

## 2017-05-01 MED ORDER — LISINOPRIL 10 MG PO TABS: 10.0000 mg | ORAL_TABLET | Freq: Every day | ORAL | 0 refills | Status: AC

## 2017-05-01 MED ORDER — TAB-A-VITE/IRON PO TABS: 1.0000 | ORAL_TABLET | Freq: Every day | ORAL | 0 refills | Status: AC

## 2017-05-01 MED ORDER — SENNOSIDES-DOCUSATE SODIUM 8.6-50 MG PO TABS: 1.0000 | ORAL_TABLET | Freq: Every evening | ORAL | Status: DC | PRN

## 2017-05-01 NOTE — Discharge Summary (Signed)
Physician Discharge Summary  Heather Henson WUX:[id number] DOB: 11-17-1975 DOA: 04/30/2017  PCP: Marda Stalker, PA-C  Admit date: 04/30/2017 Discharge date: 05/01/2017   Recommendations for Outpatient Follow-Up:   Periodic IV Fe for iron def anemia  Discharge Diagnosis:   Principal Problem:   Symptomatic anemia Active Problems:   Hypertension   Depression   Chest pain   Tightness of neck   Microcytic anemia   Lethargy   Discharge disposition:  Home  Discharge Condition: Improved.  Diet recommendation:   Wound care: None.   History of Present Illness:   Heather Henson is a 42 y.o. female with medical history significant of obesity, gastric bypass, hypertension and anxiety, fibroid (s/p of myomectomy, oophorectomy), migraine headaches, hematemesis, who presents with generalized weakness, lightheadedness, shortness of breath, intermittent chest pain, muscle tightness.  Pt states that she has been having generalized weakness, lightheadedness, mild shortness of breath for about 1 week, which has been progressively getting worse. She also has mild substernal intermittent chest pain, but she is chest pain free today. No cough, fever or chills. Denies nausea, vomiting, diarrhea, abdominal pain, hematemesis, hematuria, hematochezia. No vaginal bleeding. No symptoms of UTI. No unilateral weakness, numbness or tingling. Extremities. No vision change or hearing loss.  Pt state that she has migraine headaches. She was started with topiramate by primary care doctor on Monday. She developed muscle tightness in both hands, lower jaw, neck and upper back, and has mild pain in lower jaw, neck and upper back. She feels lethargic. No syncope. She stopped taking topiramate, but still has some muscle tightness.     Hospital Course by Problem:    Symptomatic anemia and microcytic anemia: - FOBT negative.  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 Currently CP free -resolved  Hypertension:  -switch Prinzide to lisinopril 10 mg  Depression: Stable, no suicidal or homicidal ideations. -Continue home medications: zoloft      Medical Consultants:    None.   Discharge Exam:   Vitals:   05/01/17 0639 05/01/17 1413  BP: 127/70 (!) 114/54  Pulse: 88 87  Resp: 18   Temp: 98.1 F (36.7 C)   SpO2: 100%    Vitals:   05/01/17 0400 05/01/17 0545 05/01/17 0639 05/01/17 1413  BP: 127/72 120/78 127/70 (!) 114/54  Pulse: 88 83 88 87  Resp: 16 (!) 8 18   Temp:   98.1 F (36.7 C)   TempSrc:   Oral   SpO2: 100% 96% 100%   Weight:   101.7 kg (224 lb 3.2 oz)   Height:   5\' 6"  (1.676 m)     Gen:  NAD    The results of significant diagnostics from this hospitalization (including imaging, microbiology, ancillary and laboratory) are listed below for reference.     Procedures and Diagnostic Studies:   Dg Chest 2 View  Result Date: 04/30/2017 CLINICAL DATA:  Chest pain and shortness of breath EXAM: CHEST - 2 VIEW COMPARISON:  February 25, 2010 FINDINGS: The heart size and mediastinal contours are within normal limits. Both lungs are clear. The visualized skeletal structures are unremarkable. IMPRESSION: No active cardiopulmonary disease. Electronically Signed   By: Dorise Bullion III M.D   On: 04/30/2017 18:05     Labs:  Basic Metabolic Panel: Recent Labs  Lab 04/30/17 1651 05/01/17 0759  NA 135 136  K 3.5 3.2*  CL 104 107  CO2 23 23  GLUCOSE 99 93  BUN 17 11  CREATININE 0.92 0.75  CALCIUM 8.7* 8.2*   GFR Estimated Creatinine Clearance: 109.2 mL/min (by C-G formula based on SCr of 0.75 mg/dL). Liver Function Tests: Recent Labs  Lab 04/30/17 1651  AST 17  ALT 14  ALKPHOS 52  BILITOT 0.5   PROT 6.7  ALBUMIN 3.6   No results for input(s): LIPASE, AMYLASE in the last 168 hours. Recent Labs  Lab 05/01/17 0759  AMMONIA 30   Coagulation profile No results for input(s): INR, PROTIME in the last 168 hours.  CBC: Recent Labs  Lab 04/30/17 1651 05/01/17 0759  WBC 7.3 6.7  NEUTROABS 5.2  --   HGB 7.1* 8.0*  HCT 27.7* 28.7*  MCV 66.1* 70.9*  PLT 418* 314   Cardiac Enzymes: Recent Labs  Lab 04/30/17 2144 05/01/17 0759 05/01/17 1145  CKTOTAL 48  --   --   TROPONINI <0.03 <0.03 <0.03   BNP: Invalid input(s): POCBNP CBG: No results for input(s): GLUCAP in the last 168 hours. D-Dimer No results for input(s): DDIMER in the last 72 hours. Hgb A1c Recent Labs    05/01/17 0759  HGBA1C 5.3   Lipid Profile Recent Labs    05/01/17 0759  CHOL 112  HDL 36*  LDLCALC 65  TRIG 55  CHOLHDL 3.1   Thyroid function studies No results for input(s): TSH, T4TOTAL, T3FREE, THYROIDAB in the last 72 hours.  Invalid input(s): FREET3 Anemia work up [employer]    04/30/17 2035  VITAMINB12 840  FOLATE 18.4  FERRITIN 3*  TIBC 433  IRON 10*  RETICCTPCT 1.4   Microbiology No results found for this or any previous visit (from the past 240 hour(s)).   Discharge Instructions:   Discharge Instructions    Discharge instructions   Complete by:  As directed    Iron rich food-- take vitamin C with MV with iron-- vit C helps your body absorb iron better -PCP to follow CBC and do periodic IV iron infusions if needed   Increase activity slowly   Complete by:  As directed      Allergies as of 05/01/2017      Reactions   Morphine And Related Rash   Tetracyclines & Related Rash      Medication List    STOP taking these medications   HYDROcodone-acetaminophen 5-325 MG tablet Commonly known as:  NORCO/VICODIN   lisinopril-hydrochlorothiazide 10-12.5 MG tablet Commonly known as:  PRINZIDE,ZESTORETIC   topiramate 50 MG tablet Commonly known as:  TOPAMAX      TAKE these medications   ALPRAZolam 0.25 MG tablet Commonly known as:  XANAX Take 0.25 mg by mouth 2 (two) times daily as needed.   ascorbic acid 500 MG tablet Commonly known as:  VITAMIN C Take 1 tablet (500 mg total) by mouth daily. Start taking on:  05/02/2017   lisinopril 10 MG tablet Commonly known as:  PRINIVIL,ZESTRIL Take 1 tablet (10 mg total) by mouth daily. Start taking on:  05/02/2017   multivitamins with iron Tabs tablet Take 1 tablet by mouth daily. Start taking on:  05/02/2017   sertraline 50 MG tablet Commonly known as:  ZOLOFT Take 25-50 mg by mouth daily.      Follow-up Information    Marda Stalker, PA-C. Go on 05/03/2017.   Specialty:  Family Medicine  Why:  will need PRN IV Fe infusion@2Pm  Contact information: [place] [address] [phone]            Time coordinating discharge: 35 min  Signed:  Geradine Girt   [facility] 05/01/2017, 4:25 PM

## 2017-05-01 NOTE — ED Notes (Signed)
Pt receiving blood  

## 2017-05-01 NOTE — ED Notes (Signed)
Report called to RN 3E. Accepted pt

## 2017-05-01 NOTE — ED Notes (Addendum)
Patient placed on hospital bed for comfort after ambulating to the bathroom

## 2017-05-01 NOTE — Progress Notes (Signed)
Pt rolled down to discharge holding by NT via wheelchair. Pt discharged in stable condition.

## 2017-05-02 LAB — TYPE AND SCREEN
ABO/RH(D): O POS
ANTIBODY SCREEN: NEGATIVE
UNIT DIVISION: 0
UNIT DIVISION: 0
Unit division: 0
Unit division: 0

## 2017-05-02 LAB — BPAM RBC
BLOOD PRODUCT EXPIRATION DATE: 201904042359
BLOOD PRODUCT EXPIRATION DATE: 201904062359
BLOOD PRODUCT EXPIRATION DATE: 201904062359
BLOOD PRODUCT EXPIRATION DATE: 201904062359
ISSUE DATE / TIME: 201903110229
ISSUE DATE / TIME: 201903100745
ISSUE DATE / TIME: 201903102239
UNIT TYPE AND RH: 5100
UNIT TYPE AND RH: 5100
Unit Type and Rh: 5100
Unit Type and Rh: 5100

## 2017-05-03 DIAGNOSIS — G43109 Migraine with aura, not intractable, without status migrainosus: Secondary | ICD-10-CM | POA: Diagnosis not present

## 2017-05-03 DIAGNOSIS — F419 Anxiety disorder, unspecified: Secondary | ICD-10-CM | POA: Diagnosis not present

## 2017-05-03 DIAGNOSIS — I10 Essential (primary) hypertension: Secondary | ICD-10-CM | POA: Diagnosis not present

## 2017-05-03 DIAGNOSIS — D6489 Other specified anemias: Secondary | ICD-10-CM | POA: Diagnosis not present

## 2017-05-05 ENCOUNTER — Telehealth: Payer: Self-pay | Admitting: Oncology

## 2017-05-05 NOTE — Telephone Encounter (Signed)
Received a call from [place] at [place] at [facility] to schedule the pt an urgent hem appt. Appt has been scheduled for the pt to see Dr. Alen Blew on 3/20 at 2pm. Pamala Hurry will notify the pt and let her know to arrive 30 minutes early.

## 2017-05-10 ENCOUNTER — Inpatient Hospital Stay: Payer: BLUE CROSS/BLUE SHIELD | Attending: Oncology | Admitting: Oncology

## 2017-05-10 ENCOUNTER — Telehealth: Payer: Self-pay | Admitting: Oncology

## 2017-05-10 VITALS — BP 140/87 | HR 64 | Temp 98.4°F | Resp 18 | Ht 66.0 in | Wt 228.4 lb

## 2017-05-10 DIAGNOSIS — D509 Iron deficiency anemia, unspecified: Secondary | ICD-10-CM

## 2017-05-10 DIAGNOSIS — Z79899 Other long term (current) drug therapy: Secondary | ICD-10-CM | POA: Insufficient documentation

## 2017-05-10 DIAGNOSIS — K9089 Other intestinal malabsorption: Secondary | ICD-10-CM | POA: Insufficient documentation

## 2017-05-10 DIAGNOSIS — I10 Essential (primary) hypertension: Secondary | ICD-10-CM | POA: Diagnosis not present

## 2017-05-10 DIAGNOSIS — D508 Other iron deficiency anemias: Secondary | ICD-10-CM | POA: Insufficient documentation

## 2017-05-10 DIAGNOSIS — E669 Obesity, unspecified: Secondary | ICD-10-CM | POA: Diagnosis not present

## 2017-05-10 DIAGNOSIS — Z9884 Bariatric surgery status: Secondary | ICD-10-CM | POA: Diagnosis not present

## 2017-05-10 NOTE — Progress Notes (Signed)
Reason for Referral: Iron deficiency anemia.  HPI: 42 year old woman currently of [place] where she lived for an extended period of time.  She has history of hypertension, obesity but no significant comorbid conditions.  She was in her usual state of health until she presented to the emergency department on 05/01/2017 with symptoms of fatigue, headaches and chest pain.  Her evaluation noted to have hemoglobin of 7.1, MCV of 66 and platelet of 418.  Iron studies at that time for the level of 10 and ferritin of 3.  She received packed red cell transfusion as well as IV iron infusion in the form of Feraheme and was discharged on 05/01/2017.  Since her discharge, he reports feeling well and her symptoms have resolved.  She denies any hematochezia, melena she continues to have regular menstrual cycles.  Her menses last 5-7 days and normally not heavy.  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  He has resumed all activities of daily living at this time.  She does not report any headaches, blurry vision, syncope or seizures. Does not report any fevers, chills or sweats.  Does not report any cough, wheezing or hemoptysis.  Does not report any chest pain, palpitation, orthopnea or leg edema.  Does not report any nausea, vomiting or abdominal pain.  Does not report any constipation or diarrhea.  Does not report any skeletal complaints.    Does not report frequency, urgency or hematuria.  Does not report any skin rashes or lesions. Does not report any heat or cold intolerance.  Does not report any lymphadenopathy or petechiae.  Does not report any anxiety or depression.  Remaining review of systems is negative.    Past Medical History:  Diagnosis Date  . Fibroids   . Hypertension   . Obesity   :  Past Surgical History:  Procedure Laterality Date  . GASTRIC BYPASS  2010   at [facility]  . MYOMECTOMY    . OOPHORECTOMY    :   Current Outpatient Medications:  .   ALPRAZolam (XANAX) 0.25 MG tablet, Take 0.25 mg by mouth 2 (two) times daily as needed., Disp: , Rfl: 0 .  lisinopril (PRINIVIL,ZESTRIL) 10 MG tablet, Take 1 tablet (10 mg total) by mouth daily., Disp: 30 tablet, Rfl: 0 .  Multiple Vitamins-Iron (MULTIVITAMINS WITH IRON) TABS tablet, Take 1 tablet by mouth daily., Disp: 30 tablet, Rfl: 0 .  sertraline (ZOLOFT) 50 MG tablet, Take 25-50 mg by mouth daily., Disp: , Rfl: 1 .  vitamin C (VITAMIN C) 500 MG tablet, Take 1 tablet (500 mg total) by mouth daily., Disp: , Rfl: :  Allergies  Allergen Reactions  . Morphine And Related Rash  . Tetracyclines & Related Rash  :  Family History  Problem Relation Age of Onset  . Cancer Unknown        grandfather had pancreatic cancer  :  Social History   Socioeconomic History  . Marital status: Married    Spouse name: Not on file  . Number of children: Not on file  . Years of education: Not on file  . Highest education level: Not on file  Social Needs  . Financial resource strain: Not on file  . Food insecurity - worry: Not on file  . Food insecurity - inability: Not on file  . Transportation needs - medical: Not on file  . Transportation needs - non-medical: Not on file  Occupational History    Employer: [employer]  Comment: Scientist, physiological  Tobacco Use  . Smoking status: Never Smoker  . Smokeless tobacco: Never Used  Substance and Sexual Activity  . Alcohol use: No  . Drug use: No  . Sexual activity: Yes    Birth control/protection: None  Other Topics Concern  . Not on file  Social History Narrative  . Not on file  :  Pertinent items are noted in HPI.  Exam: Blood pressure 140/87, pulse 64, temperature 98.4 F (36.9 C), temperature source Oral, resp. rate 18, height 5\' 6"  (1.676 m), weight 228 lb 6.4 oz (103.6 kg), last menstrual period 04/21/2017, SpO2 100 %, unknown if currently breastfeeding.  ECOG 0 General appearance: alert and cooperative appeared without  distress. Head: atraumatic without any abnormalities. Eyes: conjunctivae/corneas clear. PERRL.  Sclera anicteric. Throat: lips, mucosa, and tongue normal; without oral thrush or ulcers. Resp: clear to auscultation bilaterally without rhonchi, wheezes or dullness to percussion. Cardio: regular rate and rhythm, S1, S2 normal, no murmur, click, rub or gallop GI: soft, non-tender; bowel sounds normal; no masses,  no organomegaly Skin: Skin color, texture, turgor normal. No rashes or lesions Lymph nodes: Cervical, supraclavicular, and axillary nodes normal. Neurologic: Grossly normal without any motor, sensory or deep tendon reflexes. Musculoskeletal: No joint deformity or effusion.  CBC    Component Value Date/Time   WBC 6.7 05/01/2017 0759   RBC 4.05 05/01/2017 0759   HGB 8.0 (L) 05/01/2017 0759   HCT 28.7 (L) 05/01/2017 0759   PLT 314 05/01/2017 0759   MCV 70.9 (L) 05/01/2017 0759   MCH 19.8 (L) 05/01/2017 0759   MCHC 27.9 (L) 05/01/2017 0759   RDW 25.1 (H) 05/01/2017 0759   LYMPHSABS 1.7 04/30/2017 1651   MONOABS 0.3 04/30/2017 1651   EOSABS 0.1 04/30/2017 1651   BASOSABS 0.0 04/30/2017 1651     Chemistry      Component Value Date/Time   NA 136 05/01/2017 0759   K 3.2 (L) 05/01/2017 0759   CL 107 05/01/2017 0759   CO2 23 05/01/2017 0759   BUN 11 05/01/2017 0759   CREATININE 0.75 05/01/2017 0759      Component Value Date/Time   CALCIUM 8.2 (L) 05/01/2017 0759   ALKPHOS 52 04/30/2017 1651   AST 17 04/30/2017 1651   ALT 14 04/30/2017 1651   BILITOT 0.5 04/30/2017 1651      Dg Chest 2 View  Result Date: 04/30/2017 CLINICAL DATA:  Chest pain and shortness of breath EXAM: CHEST - 2 VIEW COMPARISON:  February 25, 2010 FINDINGS: The heart size and mediastinal contours are within normal limits. Both lungs are clear. The visualized skeletal structures are unremarkable. IMPRESSION: No active cardiopulmonary disease. Electronically Signed   By: Dorise Bullion III M.D   On:  04/30/2017 18:05    Assessment and Plan:   42 year old woman with the following issues:  1.  Iron deficiency anemia: Documented on April 30, 2017.  She presented with a hemoglobin of 7 iron level of 10 and ferritin of 3.  She has no documented GI blood losses with fecal occult testing is negative.  She has regular menstrual cycles but had gastric bypass operation in 2010.  Her iron deficiency anemia is related to chronic blood losses from menses in addition to poor iron absorption related to gastric bypass operation.  From a management standpoint, continuous iron supplement would be needed at this time.  These options would include oral iron versus intravenous iron.  Complication associated with oral iron includes dyspepsia, nausea and  poor absorption were discussed.  Alternatively, intravenous iron can be used intermittently.  Complication associated with Feraheme includes infusion related complications and anaphylaxis that is rare.  The plan at this point for her to continue oral iron replacement which she has started since her discharge.  We will continue to check her periodically and replace with intravenous iron as needed.  2.  Age-appropriate cancer screening: She is up-to-date at this time.  She has not had a colonoscopy.  Fecal occult testing is negative.  3.  Follow-up: We will be in 2 months to recheck her iron and replace with intravenous iron as needed.  45  minutes was spent with the patient face-to-face today.  More than 50% of time was dedicated to patient counseling, education and coordination of her care.

## 2017-05-10 NOTE — Telephone Encounter (Signed)
Appointments scheduled AVS/Calendar printed per 3/20 los 

## 2017-05-12 ENCOUNTER — Ambulatory Visit
Admission: RE | Admit: 2017-05-12 | Discharge: 2017-05-12 | Disposition: A | Discharge status: Home / Self Care | Payer: BLUE CROSS/BLUE SHIELD | Source: Ambulatory Visit | Attending: Family Medicine | Admitting: Family Medicine

## 2017-05-12 DIAGNOSIS — Z1231 Encounter for screening mammogram for malignant neoplasm of breast: Secondary | ICD-10-CM | POA: Diagnosis not present

## 2017-07-19 ENCOUNTER — Inpatient Hospital Stay: Payer: BLUE CROSS/BLUE SHIELD

## 2017-07-19 ENCOUNTER — Inpatient Hospital Stay: Payer: BLUE CROSS/BLUE SHIELD | Attending: Oncology | Admitting: Oncology

## 2017-07-19 VITALS — BP 141/78 | HR 66 | Temp 98.2°F | Resp 17 | Ht 66.0 in | Wt 221.5 lb

## 2017-07-19 DIAGNOSIS — K5900 Constipation, unspecified: Secondary | ICD-10-CM | POA: Insufficient documentation

## 2017-07-19 DIAGNOSIS — D509 Iron deficiency anemia, unspecified: Secondary | ICD-10-CM

## 2017-07-19 DIAGNOSIS — N920 Excessive and frequent menstruation with regular cycle: Secondary | ICD-10-CM | POA: Diagnosis not present

## 2017-07-19 DIAGNOSIS — Z885 Allergy status to narcotic agent status: Secondary | ICD-10-CM

## 2017-07-19 DIAGNOSIS — Z79899 Other long term (current) drug therapy: Secondary | ICD-10-CM | POA: Insufficient documentation

## 2017-07-19 DIAGNOSIS — D500 Iron deficiency anemia secondary to blood loss (chronic): Secondary | ICD-10-CM

## 2017-07-19 LAB — CBC WITH DIFFERENTIAL (CANCER CENTER ONLY)
BASOS ABS: 0 10*3/uL (ref 0.0–0.1)
Basophils Relative: 1 %
EOS PCT: 5 %
Eosinophils Absolute: 0.2 10*3/uL (ref 0.0–0.5)
HCT: 37.7 % (ref 34.8–46.6)
Hemoglobin: 11.7 g/dL (ref 11.6–15.9)
LYMPHS PCT: 37 %
Lymphs Abs: 1.3 10*3/uL (ref 0.9–3.3)
MCH: 26.8 pg (ref 25.1–34.0)
MCHC: 31 g/dL — ABNORMAL LOW (ref 31.5–36.0)
MCV: 86.5 fL (ref 79.5–101.0)
MONO ABS: 0.2 10*3/uL (ref 0.1–0.9)
Monocytes Relative: 5 %
Neutro Abs: 1.9 10*3/uL (ref 1.5–6.5)
Neutrophils Relative %: 52 %
PLATELETS: 301 10*3/uL (ref 145–400)
RBC: 4.36 MIL/uL (ref 3.70–5.45)
RDW: 16.7 % — ABNORMAL HIGH (ref 11.2–14.5)
WBC Count: 3.5 10*3/uL — ABNORMAL LOW (ref 3.9–10.3)

## 2017-07-19 LAB — IRON AND TIBC
IRON: 27 ug/dL — AB (ref 41–142)
Saturation Ratios: 8 % — ABNORMAL LOW (ref 21–57)
TIBC: 331 ug/dL (ref 236–444)
UIBC: 305 ug/dL

## 2017-07-19 LAB — FERRITIN: FERRITIN: 12 ng/mL (ref 9–269)

## 2017-07-19 NOTE — Progress Notes (Signed)
Hematology and Oncology Follow Up Visit  Heather Henson [id number] 1975/04/29 42 y.o. 07/19/2017 9:08 AM Marda Stalker, [place], [place]   Principle Diagnosis: 42 year old woman with iron deficiency anemia diagnosed in March 2019 after presenting with a hemoglobin of 7.1, MCV of 66 and ferritin of 3.   Prior Therapy:  She is status post packed red cell transfusion and intravenous iron completed in March 2019.  Current therapy: Oral iron therapy on a daily basis.  Interim History: [name] presents today for a follow-up visit.  Since the last visit, she reports feeling well without any recent complaints.  She denies any excessive fatigue or tiredness.  She denies any generalized weakness or dyspnea on exertion.  She continues to have heavy menstrual cycles on a regular basis.  She denies any hematochezia or melena.  He denies any hemoptysis or hematemesis.  She continues to take oral iron daily with constipation issues at times.  She does not report any headaches, blurry vision, syncope or seizures. Does not report any fevers, chills or sweats.  Does not report any cough, wheezing or hemoptysis.  Does not report any chest pain, palpitation, orthopnea or leg edema.  Does not report any nausea, vomiting or abdominal pain.  Does not report any constipation or diarrhea.  Does not report any skeletal complaints.    Does not report frequency, urgency or hematuria.  Does not report any skin rashes or lesions. Does not report any heat or cold intolerance.  Does not report any lymphadenopathy or petechiae.  Does not report any anxiety or depression.  Remaining review of systems is negative.    Medications: I have reviewed the patient's current medications.  Current Outpatient Medications  Medication Sig Dispense Refill  . ALPRAZolam (XANAX) 0.25 MG tablet Take 0.25 mg by mouth 2 (two) times daily as needed.  0  . lisinopril (PRINIVIL,ZESTRIL) 10 MG tablet Take 1 tablet (10 mg total) by  mouth daily. 30 tablet 0  . Multiple Vitamins-Iron (MULTIVITAMINS WITH IRON) TABS tablet Take 1 tablet by mouth daily. 30 tablet 0  . sertraline (ZOLOFT) 50 MG tablet Take 25-50 mg by mouth daily.  1  . vitamin C (VITAMIN C) 500 MG tablet Take 1 tablet (500 mg total) by mouth daily.     No current facility-administered medications for this visit.      Allergies:  Allergies  Allergen Reactions  . Morphine And Related Rash  . Tetracyclines & Related Rash    Past Medical History, Surgical history, Social history, and Family History were reviewed and updated.    Physical Exam: Blood pressure (!) 141/78, pulse 66, temperature 98.2 F (36.8 C), temperature source Oral, resp. rate 17, height 5\' 6"  (1.676 m), weight 221 lb 8 oz (100.5 kg), SpO2 98 %, unknown if currently breastfeeding. ECOG: 0 General appearance: alert and cooperative appeared without distress. Head: Normocephalic, without obvious abnormality Oropharynx: No oral thrush or ulcers. Eyes: No scleral icterus.  Pupils are equal and round reactive to light. Lymph nodes: Cervical, supraclavicular, and axillary nodes normal. Heart:regular rate and rhythm, S1, S2 normal, no murmur, click, rub or gallop Lung:chest clear, no wheezing, rales, normal symmetric air entry Abdomin: soft, non-tender, without masses or organomegaly. Neurological: No motor, sensory deficits.  Intact deep tendon reflexes. Skin: No rashes or lesions.  No ecchymosis or petechiae. Musculoskeletal: No joint deformity or effusion. .    Lab Results: Lab Results  Component Value Date   WBC 6.7 05/01/2017   HGB 8.0 (L) 05/01/2017  HCT 28.7 (L) 05/01/2017   MCV 70.9 (L) 05/01/2017   PLT 314 05/01/2017     Chemistry      Component Value Date/Time   NA 136 05/01/2017 0759   K 3.2 (L) 05/01/2017 0759   CL 107 05/01/2017 0759   CO2 23 05/01/2017 0759   BUN 11 05/01/2017 0759   CREATININE 0.75 05/01/2017 0759      Component Value Date/Time    CALCIUM 8.2 (L) 05/01/2017 0759   ALKPHOS 52 04/30/2017 1651   AST 17 04/30/2017 1651   ALT 14 04/30/2017 1651   BILITOT 0.5 04/30/2017 1651       Impression and Plan:   42 year old woman with the following issues:  1.  Iron deficiency anemia due to menstrual blood loss diagnosed in March 2019.  She received packed red cell transfusion as well as IV iron infusion.  She remains on oral iron without any complications at this time.  The plan is to repeat iron studies today and adjust her iron supplements accordingly.  Intravenous iron may be needed to be repeated in the future.  2.  Age-appropriate cancer screening: Her fecal occult testing is negative without any GI symptoms.  Her iron deficiency is more likely related to menstrual blood losses and GI blood losses.  3.  Menorrhagia: I recommended a follow-up with OB/GYN regarding this issue.  4.  Follow-up: We will be 4 months to follow her progress.  15  minutes was spent with the patient face-to-face today.  More than 50% of time was dedicated to patient counseling, education and answering questions about diagnosis, prognosis and future plan of care.      Zola Button, MD 5/29/20199:08 AM

## 2017-10-19 DIAGNOSIS — I10 Essential (primary) hypertension: Secondary | ICD-10-CM | POA: Diagnosis not present

## 2017-10-19 DIAGNOSIS — F419 Anxiety disorder, unspecified: Secondary | ICD-10-CM | POA: Diagnosis not present

## 2017-10-19 DIAGNOSIS — G43109 Migraine with aura, not intractable, without status migrainosus: Secondary | ICD-10-CM | POA: Diagnosis not present

## 2017-10-19 DIAGNOSIS — D500 Iron deficiency anemia secondary to blood loss (chronic): Secondary | ICD-10-CM | POA: Diagnosis not present

## 2017-10-19 DIAGNOSIS — Z0000 Encounter for general adult medical examination without abnormal findings: Secondary | ICD-10-CM | POA: Diagnosis not present

## 2017-10-19 DIAGNOSIS — E559 Vitamin D deficiency, unspecified: Secondary | ICD-10-CM | POA: Diagnosis not present

## 2017-10-25 DIAGNOSIS — N951 Menopausal and female climacteric states: Secondary | ICD-10-CM | POA: Diagnosis not present

## 2017-10-25 DIAGNOSIS — R635 Abnormal weight gain: Secondary | ICD-10-CM | POA: Diagnosis not present

## 2017-10-26 DIAGNOSIS — Z1339 Encounter for screening examination for other mental health and behavioral disorders: Secondary | ICD-10-CM | POA: Diagnosis not present

## 2017-10-26 DIAGNOSIS — R790 Abnormal level of blood mineral: Secondary | ICD-10-CM | POA: Diagnosis not present

## 2017-10-26 DIAGNOSIS — Z1331 Encounter for screening for depression: Secondary | ICD-10-CM | POA: Diagnosis not present

## 2017-10-26 DIAGNOSIS — N951 Menopausal and female climacteric states: Secondary | ICD-10-CM | POA: Diagnosis not present

## 2017-10-26 DIAGNOSIS — E669 Obesity, unspecified: Secondary | ICD-10-CM | POA: Diagnosis not present

## 2017-11-03 DIAGNOSIS — R790 Abnormal level of blood mineral: Secondary | ICD-10-CM | POA: Diagnosis not present

## 2017-11-03 DIAGNOSIS — E669 Obesity, unspecified: Secondary | ICD-10-CM | POA: Diagnosis not present

## 2017-11-03 DIAGNOSIS — I10 Essential (primary) hypertension: Secondary | ICD-10-CM | POA: Diagnosis not present

## 2017-11-10 DIAGNOSIS — R790 Abnormal level of blood mineral: Secondary | ICD-10-CM | POA: Diagnosis not present

## 2017-11-10 DIAGNOSIS — E669 Obesity, unspecified: Secondary | ICD-10-CM | POA: Diagnosis not present

## 2017-11-17 DIAGNOSIS — I10 Essential (primary) hypertension: Secondary | ICD-10-CM | POA: Diagnosis not present

## 2017-11-17 DIAGNOSIS — E669 Obesity, unspecified: Secondary | ICD-10-CM | POA: Diagnosis not present

## 2017-11-23 ENCOUNTER — Telehealth: Payer: Self-pay | Admitting: Oncology

## 2017-11-23 NOTE — Telephone Encounter (Signed)
Returned pts call to cancel appts. Will call to r/s appts.

## 2017-11-24 ENCOUNTER — Inpatient Hospital Stay: Payer: BLUE CROSS/BLUE SHIELD | Admitting: Oncology

## 2017-11-24 ENCOUNTER — Inpatient Hospital Stay: Payer: BLUE CROSS/BLUE SHIELD

## 2017-11-24 DIAGNOSIS — E559 Vitamin D deficiency, unspecified: Secondary | ICD-10-CM | POA: Diagnosis not present

## 2017-11-24 DIAGNOSIS — R790 Abnormal level of blood mineral: Secondary | ICD-10-CM | POA: Diagnosis not present

## 2017-11-24 DIAGNOSIS — E669 Obesity, unspecified: Secondary | ICD-10-CM | POA: Diagnosis not present

## 2017-12-01 DIAGNOSIS — E669 Obesity, unspecified: Secondary | ICD-10-CM | POA: Diagnosis not present

## 2017-12-01 DIAGNOSIS — I10 Essential (primary) hypertension: Secondary | ICD-10-CM | POA: Diagnosis not present

## 2017-12-08 DIAGNOSIS — E669 Obesity, unspecified: Secondary | ICD-10-CM | POA: Diagnosis not present

## 2017-12-08 DIAGNOSIS — E559 Vitamin D deficiency, unspecified: Secondary | ICD-10-CM | POA: Diagnosis not present

## 2017-12-15 DIAGNOSIS — E559 Vitamin D deficiency, unspecified: Secondary | ICD-10-CM | POA: Diagnosis not present

## 2017-12-15 DIAGNOSIS — I10 Essential (primary) hypertension: Secondary | ICD-10-CM | POA: Diagnosis not present

## 2017-12-15 DIAGNOSIS — E669 Obesity, unspecified: Secondary | ICD-10-CM | POA: Diagnosis not present

## 2017-12-29 DIAGNOSIS — I10 Essential (primary) hypertension: Secondary | ICD-10-CM | POA: Diagnosis not present

## 2017-12-29 DIAGNOSIS — E669 Obesity, unspecified: Secondary | ICD-10-CM | POA: Diagnosis not present

## 2018-01-12 DIAGNOSIS — I10 Essential (primary) hypertension: Secondary | ICD-10-CM | POA: Diagnosis not present

## 2018-01-12 DIAGNOSIS — E669 Obesity, unspecified: Secondary | ICD-10-CM | POA: Diagnosis not present

## 2018-01-12 DIAGNOSIS — E559 Vitamin D deficiency, unspecified: Secondary | ICD-10-CM | POA: Diagnosis not present

## 2018-01-26 DIAGNOSIS — I10 Essential (primary) hypertension: Secondary | ICD-10-CM | POA: Diagnosis not present

## 2018-01-26 DIAGNOSIS — E669 Obesity, unspecified: Secondary | ICD-10-CM | POA: Diagnosis not present

## 2018-02-02 DIAGNOSIS — R790 Abnormal level of blood mineral: Secondary | ICD-10-CM | POA: Diagnosis not present

## 2018-02-02 DIAGNOSIS — E559 Vitamin D deficiency, unspecified: Secondary | ICD-10-CM | POA: Diagnosis not present

## 2018-02-02 DIAGNOSIS — I10 Essential (primary) hypertension: Secondary | ICD-10-CM | POA: Diagnosis not present

## 2018-02-02 DIAGNOSIS — E669 Obesity, unspecified: Secondary | ICD-10-CM | POA: Diagnosis not present

## 2018-04-02 ENCOUNTER — Other Ambulatory Visit: Payer: Self-pay | Admitting: Family Medicine

## 2018-04-02 DIAGNOSIS — Z1231 Encounter for screening mammogram for malignant neoplasm of breast: Secondary | ICD-10-CM

## 2018-05-15 ENCOUNTER — Ambulatory Visit: Payer: BLUE CROSS/BLUE SHIELD

## 2018-06-12 ENCOUNTER — Ambulatory Visit: Payer: BLUE CROSS/BLUE SHIELD

## 2018-07-30 ENCOUNTER — Ambulatory Visit
Admission: RE | Admit: 2018-07-30 | Discharge: 2018-07-30 | Disposition: A | Discharge status: Home / Self Care | Payer: BC Managed Care – PPO | Source: Ambulatory Visit | Attending: Family Medicine | Admitting: Family Medicine

## 2018-07-30 ENCOUNTER — Other Ambulatory Visit: Payer: Self-pay

## 2018-07-30 DIAGNOSIS — Z1231 Encounter for screening mammogram for malignant neoplasm of breast: Secondary | ICD-10-CM

## 2019-01-11 ENCOUNTER — Other Ambulatory Visit (HOSPITAL_COMMUNITY)
Admission: RE | Admit: 2019-01-11 | Discharge: 2019-01-11 | Disposition: A | Discharge status: Home / Self Care | Payer: BC Managed Care – PPO | Source: Ambulatory Visit | Attending: Obstetrics and Gynecology | Admitting: Obstetrics and Gynecology

## 2019-01-11 ENCOUNTER — Other Ambulatory Visit: Payer: Self-pay | Admitting: Obstetrics and Gynecology

## 2019-01-11 DIAGNOSIS — Z124 Encounter for screening for malignant neoplasm of cervix: Secondary | ICD-10-CM | POA: Insufficient documentation

## 2019-01-14 LAB — CYTOLOGY - PAP
Adequacy: ABSENT
Comment: NEGATIVE
Diagnosis: NEGATIVE
High risk HPV: NEGATIVE

## 2019-01-25 ENCOUNTER — Other Ambulatory Visit: Payer: Self-pay | Admitting: Obstetrics and Gynecology

## 2019-01-28 ENCOUNTER — Other Ambulatory Visit: Payer: Self-pay | Admitting: Obstetrics and Gynecology

## 2019-01-28 DIAGNOSIS — D250 Submucous leiomyoma of uterus: Secondary | ICD-10-CM

## 2019-03-05 ENCOUNTER — Ambulatory Visit
Admission: RE | Admit: 2019-03-05 | Discharge: 2019-03-05 | Disposition: A | Discharge status: Home / Self Care | Payer: BC Managed Care – PPO | Source: Ambulatory Visit | Attending: Obstetrics and Gynecology | Admitting: Obstetrics and Gynecology

## 2019-03-05 ENCOUNTER — Other Ambulatory Visit: Payer: Self-pay

## 2019-03-05 ENCOUNTER — Encounter: Payer: Self-pay | Admitting: *Deleted

## 2019-03-05 DIAGNOSIS — D250 Submucous leiomyoma of uterus: Secondary | ICD-10-CM

## 2019-03-05 HISTORY — PX: IR RADIOLOGIST EVAL & MGMT: IMG5224

## 2019-03-05 NOTE — Consult Note (Signed)
Chief Complaint: Patient was consulted remotely today (TeleHealth) for uterine fibroids and menorrhagia at the request of [name],[name].    Referring Physician(s): [name],[name]  History of Present Illness: Heather Henson is a 44 y.o. female with known uterine fibroids with menorrhagia and history of anemia.  Patient had a myomectomy procedure in 2013 but now has recurrent symptoms.  Patient has actually been hospitalized in the past for anemia.  Currently her anemia is well controlled.  Pregnancy history is G4, P2.  Patient's menstrual periods are very regular lasting 7 days.  6 of the 7 days are very heavy and she may be changing pads every hour.  She also has significant pain and cramping during her periods but she does not have inter-period bleeding.  Past medical history is significant for obesity and she had a gastric bypass procedure.  History of left ovarian torsion and left oophorectomy.  She currently works at home for a [employer].  Endometrial biopsy on 01/25/2019 was benign.  Pap smear on 01/11/2019 was negative for HPV and negative for intraepithelial lesion or malignancy.  Past Medical History:  Diagnosis Date  . Fibroids   . Hypertension   . Obesity     Past Surgical History:  Procedure Laterality Date  . GASTRIC BYPASS  2010   at [facility]  . MYOMECTOMY    . OOPHORECTOMY      Allergies: Morphine and related and Tetracyclines & related  Medications: Prior to Admission medications   Medication Sig Start Date End Date Taking? Authorizing Provider  ALPRAZolam (XANAX) 0.25 MG tablet Take 0.25 mg by mouth 2 (two) times daily as needed. 04/25/17   [provider]  lisinopril (PRINIVIL,ZESTRIL) 10 MG tablet Take 1 tablet (10 mg total) by mouth daily. 05/02/17   Geradine Girt, DO  Multiple Vitamins-Iron (MULTIVITAMINS WITH IRON) TABS tablet Take 1 tablet by mouth daily. 05/02/17   Geradine Girt, DO  sertraline (ZOLOFT) 50 MG tablet Take 25-50 mg by mouth daily.  04/24/17   [provider]  vitamin C (VITAMIN C) 500 MG tablet Take 1 tablet (500 mg total) by mouth daily. 05/02/17   Geradine Girt, DO     Family History  Problem Relation Age of Onset  . Cancer Other        grandfather had pancreatic cancer    Social History   Socioeconomic History  . Marital status: Married    Spouse name: Not on file  . Number of children: Not on file  . Years of education: Not on file  . Highest education level: Not on file  Occupational History    Employer: [employer]    Comment: Scientist, physiological  Tobacco Use  . Smoking status: Never Smoker  . Smokeless tobacco: Never Used  Substance and Sexual Activity  . Alcohol use: No  . Drug use: No  . Sexual activity: Yes    Birth control/protection: None  Other Topics Concern  . Not on file  Social History Narrative  . Not on file   Social Determinants of Health   Financial Resource Strain:   . Difficulty of Paying Living Expenses: Not on file  Food Insecurity:   . Worried About Charity fundraiser in the Last Year: Not on file  . Ran Out of Food in the Last Year: Not on file  Transportation Needs:   . Lack of Transportation (Medical): Not on file  . Lack of Transportation (Non-Medical): Not on file  Physical Activity:   .  Days of Exercise per Week: Not on file  . Minutes of Exercise per Session: Not on file  Stress:   . Feeling of Stress : Not on file  Social Connections:   . Frequency of Communication with Friends and Family: Not on file  . Frequency of Social Gatherings with Friends and Family: Not on file  . Attends Religious Services: Not on file  . Active Member of Clubs or Organizations: Not on file  . Attends Archivist Meetings: Not on file  . Marital Status: Not on file   Review of Systems  Genitourinary: Positive for menstrual problem and pelvic pain.    Physical Exam No direct physical exam was performed    Vital Signs: There were no vitals taken for this  visit.  Imaging: Imaging report from [facility] dated 01/25/2019.  The uterus appears to contain fibroids.  The uterus is anteverted.  The endometrium appears thickened.  Right ovary appears normal in size and shape.  Left ovary appears to be surgically absent.  Multiple fibroids were measured.  Largest fibroid measures up to 6.6 cm.  Reportedly, the fibroids are similar to the exam on 01/09/2012.  Labs:  CBC: No results for input(s): WBC, HGB, HCT, PLT in the last 8760 hours.  COAGS: No results for input(s): INR, APTT in the last 8760 hours.  BMP: No results for input(s): NA, K, CL, CO2, GLUCOSE, BUN, CALCIUM, CREATININE, GFRNONAA, GFRAA in the last 8760 hours.  Invalid input(s): CMP  LIVER FUNCTION TESTS: No results for input(s): BILITOT, AST, ALT, ALKPHOS, PROT, ALBUMIN in the last 8760 hours.  TUMOR MARKERS: No results for input(s): AFPTM, CEA, CA199, CHROMGRNA in the last 8760 hours.  Assessment and Plan:  44 year old female with known uterine fibroids and severe menorrhagia.  Patient has actually been hospitalized in the past for anemia.  Currently, the patient's anemia is controlled.  Prior myomectomy procedure in 2013 but her symptoms are just as bad as they were prior to the myomectomy.  Patient would like management of her uterine fibroids and menorrhagia but interested in alternatives to hysterectomy.  We discussed the uterine artery embolization procedure in depth.  Discussed vascular access from the groin or left radial artery.  Discussed overnight observation to control the post embolization symptoms.  Explained that most women will be out of work for approximately 2 weeks.  Discussed the risks of the procedure which include bleeding, infection and damage to vascular structures.  Based on the patient's history and prior imaging, I suspect that she will be a candidate for uterine artery embolization but we would need to get a pelvic MRI to evaluate the uterus and fibroids  prior to her procedure.  Patient is interested in the uterine artery embolization procedure and would like to schedule a pelvic MRI.  We will schedule a pelvic MRI and let her know if she would be a candidate for the procedure.  Thank you for this interesting consult.  I greatly enjoyed meeting Loey Halderman and look forward to participating in their care.  A copy of this report was sent to the requesting provider on this date.  Electronically Signed: Burman Riis 03/05/2019, 3:28 PM   I spent a total of 25 minutes  in remote  clinical consultation, greater than 50% of which was counseling/coordinating care for uterine fibroid management.    Visit type: Audio only (telephone). Audio (no video) only due to patient preference. Alternative for in-person consultation at [facility], [address] [place],  [place], [place]. This visit type was conducted due to national recommendations for restrictions regarding the COVID-19 Pandemic (e.g. social distancing).  This format is felt to be most appropriate for this patient at this time.  All issues noted in this document were discussed and addressed.

## 2019-03-06 ENCOUNTER — Other Ambulatory Visit: Payer: Self-pay | Admitting: Interventional Radiology

## 2019-03-06 DIAGNOSIS — D250 Submucous leiomyoma of uterus: Secondary | ICD-10-CM

## 2019-03-13 ENCOUNTER — Ambulatory Visit (HOSPITAL_COMMUNITY)
Admission: RE | Admit: 2019-03-13 | Discharge: 2019-03-13 | Disposition: A | Discharge status: Home / Self Care | Payer: BC Managed Care – PPO | Source: Ambulatory Visit | Attending: Interventional Radiology | Admitting: Interventional Radiology

## 2019-03-13 ENCOUNTER — Other Ambulatory Visit (HOSPITAL_COMMUNITY): Payer: BC Managed Care – PPO

## 2019-03-13 ENCOUNTER — Other Ambulatory Visit: Payer: Self-pay

## 2019-03-13 DIAGNOSIS — D250 Submucous leiomyoma of uterus: Secondary | ICD-10-CM | POA: Insufficient documentation

## 2019-03-13 LAB — POCT I-STAT CREATININE: Creatinine, Ser: 0.9 mg/dL (ref 0.44–1.00)

## 2019-03-13 MED ORDER — GADOBUTROL 1 MMOL/ML IV SOLN
10.0000 mL | Freq: Once | INTRAVENOUS | Status: AC | PRN
  Administered 2019-03-13: 08:00:00 10 mL via INTRAVENOUS

## 2019-04-09 ENCOUNTER — Other Ambulatory Visit (HOSPITAL_COMMUNITY): Payer: Self-pay | Admitting: Diagnostic Radiology

## 2019-04-09 DIAGNOSIS — D259 Leiomyoma of uterus, unspecified: Secondary | ICD-10-CM

## 2019-06-10 DIAGNOSIS — Z6839 Body mass index (BMI) 39.0-39.9, adult: Secondary | ICD-10-CM | POA: Diagnosis not present

## 2019-06-10 DIAGNOSIS — I10 Essential (primary) hypertension: Secondary | ICD-10-CM | POA: Diagnosis not present

## 2019-06-19 DIAGNOSIS — R0789 Other chest pain: Secondary | ICD-10-CM | POA: Diagnosis not present

## 2019-06-19 DIAGNOSIS — M62838 Other muscle spasm: Secondary | ICD-10-CM | POA: Diagnosis not present

## 2019-06-20 DIAGNOSIS — R790 Abnormal level of blood mineral: Secondary | ICD-10-CM | POA: Diagnosis not present

## 2019-06-20 DIAGNOSIS — Z6839 Body mass index (BMI) 39.0-39.9, adult: Secondary | ICD-10-CM | POA: Diagnosis not present

## 2019-06-27 DIAGNOSIS — Z6838 Body mass index (BMI) 38.0-38.9, adult: Secondary | ICD-10-CM | POA: Diagnosis not present

## 2019-06-27 DIAGNOSIS — I10 Essential (primary) hypertension: Secondary | ICD-10-CM | POA: Diagnosis not present

## 2019-07-04 DIAGNOSIS — I10 Essential (primary) hypertension: Secondary | ICD-10-CM | POA: Diagnosis not present

## 2019-07-04 DIAGNOSIS — E559 Vitamin D deficiency, unspecified: Secondary | ICD-10-CM | POA: Diagnosis not present

## 2019-07-04 DIAGNOSIS — Z6837 Body mass index (BMI) 37.0-37.9, adult: Secondary | ICD-10-CM | POA: Diagnosis not present

## 2019-09-20 ENCOUNTER — Other Ambulatory Visit: Payer: Self-pay | Admitting: Family Medicine

## 2019-09-20 DIAGNOSIS — Z1231 Encounter for screening mammogram for malignant neoplasm of breast: Secondary | ICD-10-CM

## 2019-09-23 ENCOUNTER — Ambulatory Visit: Payer: BC Managed Care – PPO

## 2019-10-03 ENCOUNTER — Ambulatory Visit
Admission: RE | Admit: 2019-10-03 | Discharge: 2019-10-03 | Disposition: A | Discharge status: Home / Self Care | Payer: BC Managed Care – PPO | Source: Ambulatory Visit | Attending: Family Medicine | Admitting: Family Medicine

## 2019-10-03 ENCOUNTER — Other Ambulatory Visit: Payer: Self-pay

## 2019-10-03 DIAGNOSIS — Z1231 Encounter for screening mammogram for malignant neoplasm of breast: Secondary | ICD-10-CM | POA: Diagnosis not present

## 2019-10-07 ENCOUNTER — Other Ambulatory Visit: Payer: Self-pay | Admitting: Family Medicine

## 2019-10-07 DIAGNOSIS — R928 Other abnormal and inconclusive findings on diagnostic imaging of breast: Secondary | ICD-10-CM

## 2019-10-14 ENCOUNTER — Other Ambulatory Visit: Payer: BC Managed Care – PPO

## 2019-10-23 ENCOUNTER — Other Ambulatory Visit: Payer: BC Managed Care – PPO

## 2019-11-05 DIAGNOSIS — Z23 Encounter for immunization: Secondary | ICD-10-CM | POA: Diagnosis not present

## 2019-11-05 DIAGNOSIS — Z0000 Encounter for general adult medical examination without abnormal findings: Secondary | ICD-10-CM | POA: Diagnosis not present

## 2019-11-05 DIAGNOSIS — E559 Vitamin D deficiency, unspecified: Secondary | ICD-10-CM | POA: Diagnosis not present

## 2019-11-05 DIAGNOSIS — Z9884 Bariatric surgery status: Secondary | ICD-10-CM | POA: Diagnosis not present

## 2019-11-05 DIAGNOSIS — I10 Essential (primary) hypertension: Secondary | ICD-10-CM | POA: Diagnosis not present

## 2019-11-06 ENCOUNTER — Ambulatory Visit
Admission: RE | Admit: 2019-11-06 | Discharge: 2019-11-06 | Disposition: A | Discharge status: Home / Self Care | Payer: BC Managed Care – PPO | Source: Ambulatory Visit | Attending: Family Medicine | Admitting: Family Medicine

## 2019-11-06 ENCOUNTER — Other Ambulatory Visit: Payer: Self-pay

## 2019-11-06 DIAGNOSIS — N6489 Other specified disorders of breast: Secondary | ICD-10-CM | POA: Diagnosis not present

## 2019-11-06 DIAGNOSIS — R928 Other abnormal and inconclusive findings on diagnostic imaging of breast: Secondary | ICD-10-CM

## 2019-11-08 DIAGNOSIS — I10 Essential (primary) hypertension: Secondary | ICD-10-CM | POA: Diagnosis not present

## 2019-11-08 DIAGNOSIS — E559 Vitamin D deficiency, unspecified: Secondary | ICD-10-CM | POA: Diagnosis not present

## 2019-11-22 DIAGNOSIS — Z9884 Bariatric surgery status: Secondary | ICD-10-CM | POA: Diagnosis not present

## 2019-11-27 DIAGNOSIS — N951 Menopausal and female climacteric states: Secondary | ICD-10-CM | POA: Diagnosis not present

## 2019-11-27 DIAGNOSIS — Z6841 Body Mass Index (BMI) 40.0 and over, adult: Secondary | ICD-10-CM | POA: Diagnosis not present

## 2019-11-27 DIAGNOSIS — I10 Essential (primary) hypertension: Secondary | ICD-10-CM | POA: Diagnosis not present

## 2019-11-29 DIAGNOSIS — E559 Vitamin D deficiency, unspecified: Secondary | ICD-10-CM | POA: Diagnosis not present

## 2019-11-29 DIAGNOSIS — R790 Abnormal level of blood mineral: Secondary | ICD-10-CM | POA: Diagnosis not present

## 2019-11-29 DIAGNOSIS — N951 Menopausal and female climacteric states: Secondary | ICD-10-CM | POA: Diagnosis not present

## 2019-12-04 DIAGNOSIS — E559 Vitamin D deficiency, unspecified: Secondary | ICD-10-CM | POA: Diagnosis not present

## 2019-12-11 DIAGNOSIS — Z6841 Body Mass Index (BMI) 40.0 and over, adult: Secondary | ICD-10-CM | POA: Diagnosis not present

## 2019-12-11 DIAGNOSIS — I10 Essential (primary) hypertension: Secondary | ICD-10-CM | POA: Diagnosis not present

## 2019-12-18 DIAGNOSIS — Z6841 Body Mass Index (BMI) 40.0 and over, adult: Secondary | ICD-10-CM | POA: Diagnosis not present

## 2019-12-18 DIAGNOSIS — Z9884 Bariatric surgery status: Secondary | ICD-10-CM | POA: Diagnosis not present

## 2019-12-18 DIAGNOSIS — K912 Postsurgical malabsorption, not elsewhere classified: Secondary | ICD-10-CM | POA: Diagnosis not present

## 2019-12-18 DIAGNOSIS — I10 Essential (primary) hypertension: Secondary | ICD-10-CM | POA: Diagnosis not present

## 2020-01-01 DIAGNOSIS — N951 Menopausal and female climacteric states: Secondary | ICD-10-CM | POA: Diagnosis not present

## 2020-01-01 DIAGNOSIS — R790 Abnormal level of blood mineral: Secondary | ICD-10-CM | POA: Diagnosis not present

## 2020-01-01 DIAGNOSIS — Z6841 Body Mass Index (BMI) 40.0 and over, adult: Secondary | ICD-10-CM | POA: Diagnosis not present

## 2020-01-03 DIAGNOSIS — Z01419 Encounter for gynecological examination (general) (routine) without abnormal findings: Secondary | ICD-10-CM | POA: Diagnosis not present

## 2020-01-29 DIAGNOSIS — N951 Menopausal and female climacteric states: Secondary | ICD-10-CM | POA: Diagnosis not present

## 2020-01-29 DIAGNOSIS — E559 Vitamin D deficiency, unspecified: Secondary | ICD-10-CM | POA: Diagnosis not present

## 2020-01-29 DIAGNOSIS — Z6841 Body Mass Index (BMI) 40.0 and over, adult: Secondary | ICD-10-CM | POA: Diagnosis not present

## 2020-02-18 DIAGNOSIS — M5106 Intervertebral disc disorders with myelopathy, lumbar region: Secondary | ICD-10-CM | POA: Diagnosis not present

## 2020-02-18 DIAGNOSIS — M25551 Pain in right hip: Secondary | ICD-10-CM | POA: Diagnosis not present

## 2020-02-18 DIAGNOSIS — M25552 Pain in left hip: Secondary | ICD-10-CM | POA: Diagnosis not present

## 2020-03-11 DIAGNOSIS — M5417 Radiculopathy, lumbosacral region: Secondary | ICD-10-CM | POA: Diagnosis not present

## 2020-03-17 DIAGNOSIS — M5417 Radiculopathy, lumbosacral region: Secondary | ICD-10-CM | POA: Diagnosis not present

## 2020-03-20 DIAGNOSIS — M5417 Radiculopathy, lumbosacral region: Secondary | ICD-10-CM | POA: Diagnosis not present

## 2020-03-24 DIAGNOSIS — M5417 Radiculopathy, lumbosacral region: Secondary | ICD-10-CM | POA: Diagnosis not present

## 2020-03-27 DIAGNOSIS — M5417 Radiculopathy, lumbosacral region: Secondary | ICD-10-CM | POA: Diagnosis not present

## 2020-03-30 DIAGNOSIS — M5417 Radiculopathy, lumbosacral region: Secondary | ICD-10-CM | POA: Diagnosis not present

## 2020-04-01 DIAGNOSIS — Z6841 Body Mass Index (BMI) 40.0 and over, adult: Secondary | ICD-10-CM | POA: Diagnosis not present

## 2020-04-01 DIAGNOSIS — E559 Vitamin D deficiency, unspecified: Secondary | ICD-10-CM | POA: Diagnosis not present

## 2020-04-01 DIAGNOSIS — I10 Essential (primary) hypertension: Secondary | ICD-10-CM | POA: Diagnosis not present

## 2020-04-06 DIAGNOSIS — M5417 Radiculopathy, lumbosacral region: Secondary | ICD-10-CM | POA: Diagnosis not present

## 2020-04-08 DIAGNOSIS — R790 Abnormal level of blood mineral: Secondary | ICD-10-CM | POA: Diagnosis not present

## 2020-04-08 DIAGNOSIS — Z6841 Body Mass Index (BMI) 40.0 and over, adult: Secondary | ICD-10-CM | POA: Diagnosis not present

## 2020-04-17 DIAGNOSIS — Z6841 Body Mass Index (BMI) 40.0 and over, adult: Secondary | ICD-10-CM | POA: Diagnosis not present

## 2020-04-17 DIAGNOSIS — R635 Abnormal weight gain: Secondary | ICD-10-CM | POA: Diagnosis not present

## 2020-04-24 DIAGNOSIS — U071 COVID-19: Secondary | ICD-10-CM | POA: Diagnosis not present

## 2020-04-24 DIAGNOSIS — E559 Vitamin D deficiency, unspecified: Secondary | ICD-10-CM | POA: Diagnosis not present

## 2020-04-30 DIAGNOSIS — Z9884 Bariatric surgery status: Secondary | ICD-10-CM | POA: Diagnosis not present

## 2020-04-30 DIAGNOSIS — Z01818 Encounter for other preprocedural examination: Secondary | ICD-10-CM | POA: Diagnosis not present

## 2020-04-30 DIAGNOSIS — K5900 Constipation, unspecified: Secondary | ICD-10-CM | POA: Diagnosis not present

## 2020-04-30 DIAGNOSIS — R140 Abdominal distension (gaseous): Secondary | ICD-10-CM | POA: Diagnosis not present

## 2020-05-01 DIAGNOSIS — R790 Abnormal level of blood mineral: Secondary | ICD-10-CM | POA: Diagnosis not present

## 2020-05-01 DIAGNOSIS — Z6839 Body mass index (BMI) 39.0-39.9, adult: Secondary | ICD-10-CM | POA: Diagnosis not present

## 2020-05-08 DIAGNOSIS — Z6841 Body Mass Index (BMI) 40.0 and over, adult: Secondary | ICD-10-CM | POA: Diagnosis not present

## 2020-05-08 DIAGNOSIS — E559 Vitamin D deficiency, unspecified: Secondary | ICD-10-CM | POA: Diagnosis not present

## 2020-06-19 DIAGNOSIS — L810 Postinflammatory hyperpigmentation: Secondary | ICD-10-CM | POA: Diagnosis not present

## 2020-06-19 DIAGNOSIS — A609 Anogenital herpesviral infection, unspecified: Secondary | ICD-10-CM | POA: Diagnosis not present

## 2020-07-02 DIAGNOSIS — D122 Benign neoplasm of ascending colon: Secondary | ICD-10-CM | POA: Diagnosis not present

## 2020-07-02 DIAGNOSIS — Z9884 Bariatric surgery status: Secondary | ICD-10-CM | POA: Diagnosis not present

## 2020-07-02 DIAGNOSIS — K621 Rectal polyp: Secondary | ICD-10-CM | POA: Diagnosis not present

## 2020-07-02 DIAGNOSIS — R140 Abdominal distension (gaseous): Secondary | ICD-10-CM | POA: Diagnosis not present

## 2020-07-02 DIAGNOSIS — R768 Other specified abnormal immunological findings in serum: Secondary | ICD-10-CM | POA: Diagnosis not present

## 2020-07-02 DIAGNOSIS — K648 Other hemorrhoids: Secondary | ICD-10-CM | POA: Diagnosis not present

## 2020-07-02 DIAGNOSIS — Z1211 Encounter for screening for malignant neoplasm of colon: Secondary | ICD-10-CM | POA: Diagnosis not present

## 2020-10-09 ENCOUNTER — Other Ambulatory Visit: Payer: Self-pay | Admitting: Family Medicine

## 2020-10-09 DIAGNOSIS — Z1231 Encounter for screening mammogram for malignant neoplasm of breast: Secondary | ICD-10-CM

## 2020-10-10 ENCOUNTER — Ambulatory Visit
Admission: RE | Admit: 2020-10-10 | Discharge: 2020-10-10 | Disposition: A | Discharge status: Home / Self Care | Payer: BC Managed Care – PPO | Source: Ambulatory Visit | Attending: Family Medicine | Admitting: Family Medicine

## 2020-10-10 ENCOUNTER — Other Ambulatory Visit: Payer: Self-pay

## 2020-10-10 DIAGNOSIS — Z1231 Encounter for screening mammogram for malignant neoplasm of breast: Secondary | ICD-10-CM

## 2020-10-27 DIAGNOSIS — R059 Cough, unspecified: Secondary | ICD-10-CM | POA: Diagnosis not present

## 2020-10-27 DIAGNOSIS — Z03818 Encounter for observation for suspected exposure to other biological agents ruled out: Secondary | ICD-10-CM | POA: Diagnosis not present

## 2020-11-18 DIAGNOSIS — R0602 Shortness of breath: Secondary | ICD-10-CM | POA: Diagnosis not present

## 2020-11-18 DIAGNOSIS — J209 Acute bronchitis, unspecified: Secondary | ICD-10-CM | POA: Diagnosis not present

## 2020-12-11 IMAGING — MR MR PELVIS WO/W CM
7 of 15 series · 18 of 48 positions shown · IV contrast (gadavist)
Comparison: 10/11/2011 CT abdomen/pelvis.

CLINICAL DATA: Uterine fibroids, history of myomectomy, pre-UAE
evaluation.

EXAM:
MRI PELVIS WITHOUT AND WITH CONTRAST
TECHNIQUE: Multiplanar multisequence MR imaging of the pelvis was performed
both before and after administration of intravenous contrast.
CONTRAST:  10mL GADAVIST GADOBUTROL 1 MMOL/ML IV SOLN

[Series 3: T2 · coronal · 6.0mm · 0.86mm/px · 3 of 35 slices shown (1 of 4)]
[im 1/35]
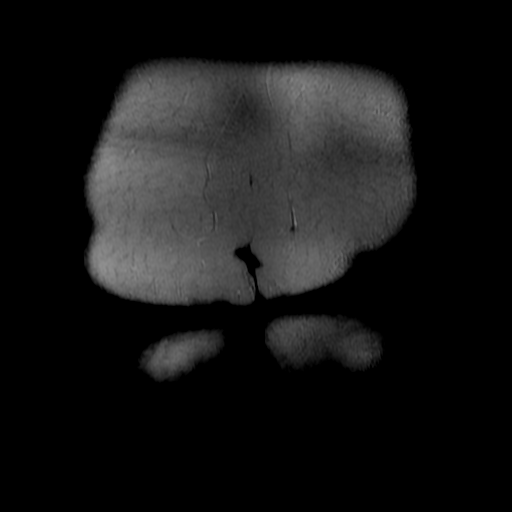
[im 18/35]
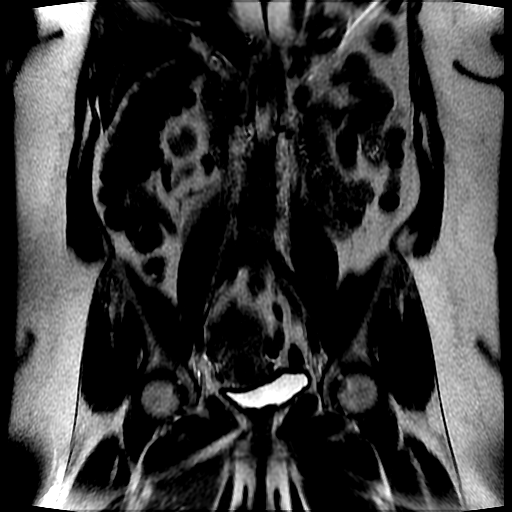
[im 35/35]
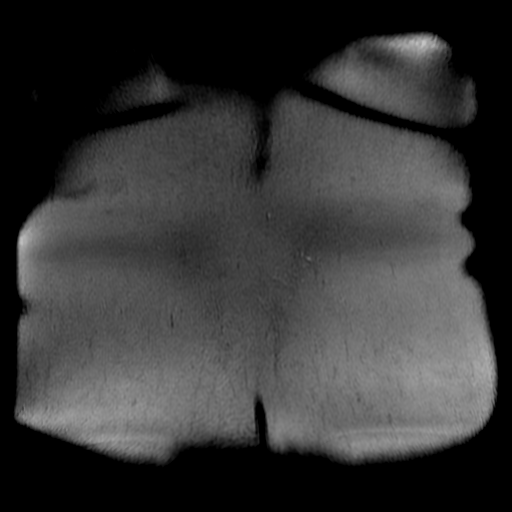

[Series 4: T2 · coronal · 5.0mm · 0.47mm/px · 2 of 33 slices shown (2 of 4)]
[im 1/33]
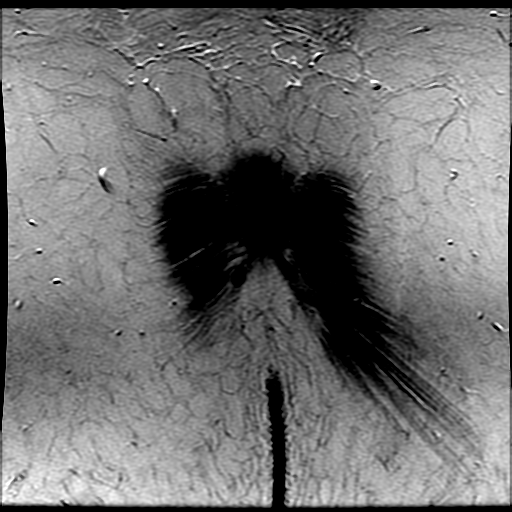
[im 33/33]
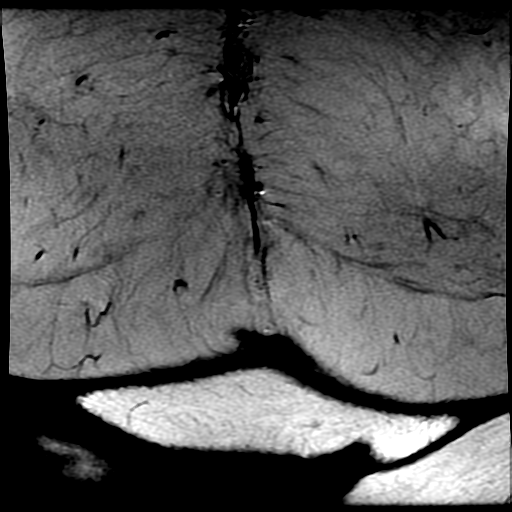

[Series 5: T2 · axial · 5.0mm · 0.47mm/px · z∈[-204,+66]mm · 3 of 46 slices shown (3 of 4)]
[im 1/46]
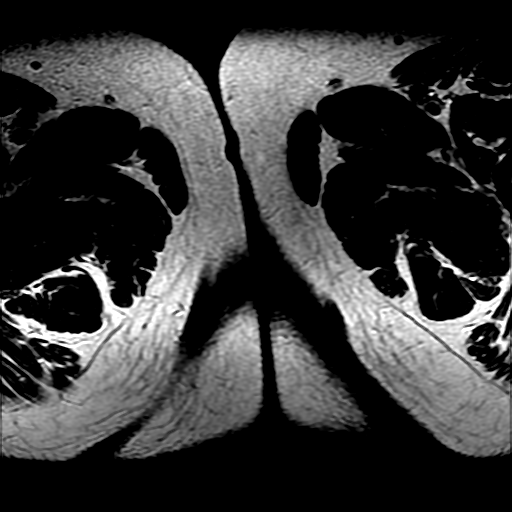
[im 23/46]
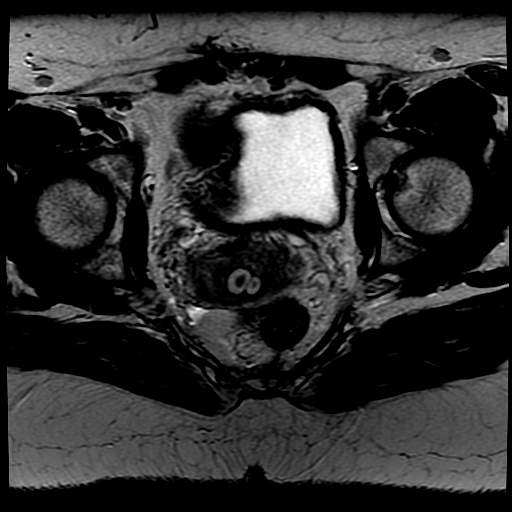
[im 46/46]
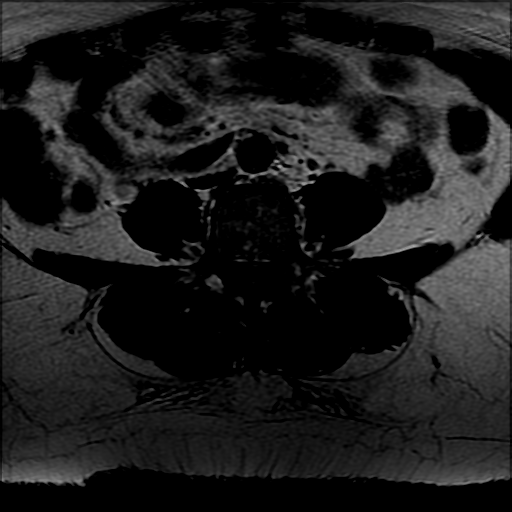

[Series 6: T2 fat-sat · axial · 5.0mm · 0.47mm/px · z∈[-204,+66]mm · 3 of 46 slices shown]
[im 1/46]
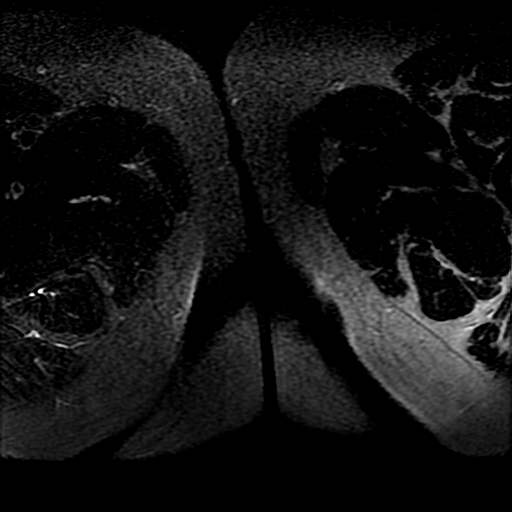
[im 23/46]
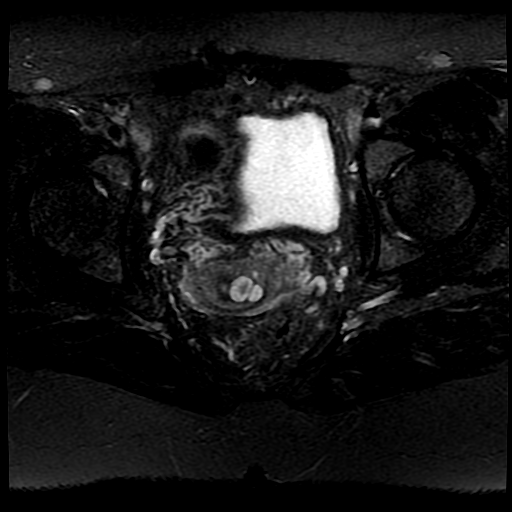
[im 46/46]
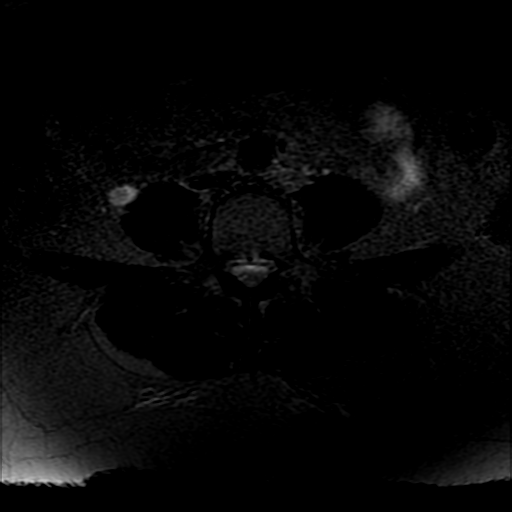

[Series 7: T2 · sagittal · 5.0mm · 0.47mm/px · 2 of 26 slices shown (4 of 4)]
[im 1/26]
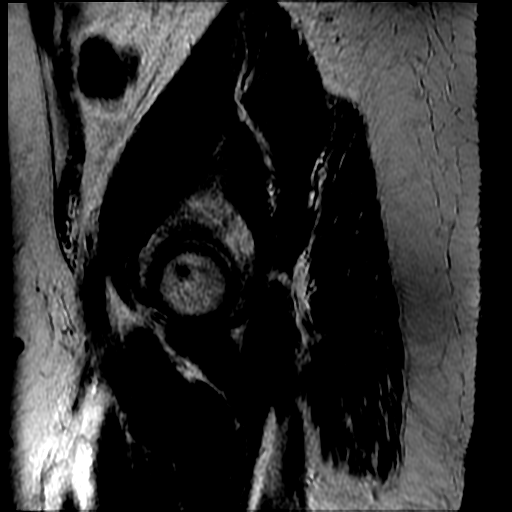
[im 26/26]
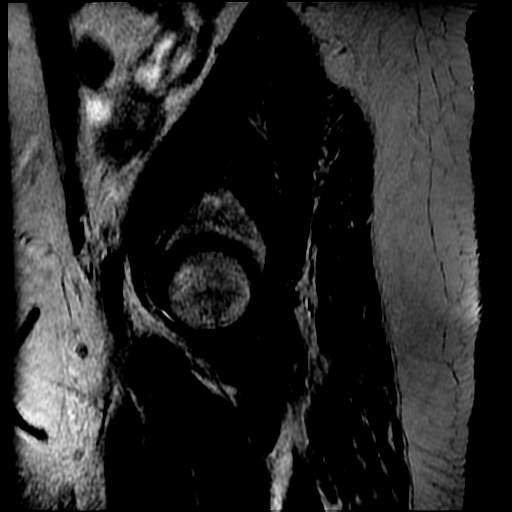

[Series 8: T1 · axial · 5.0mm · 0.47mm/px · z∈[-204,+66]mm · 3 of 46 slices shown]
[im 1/46]
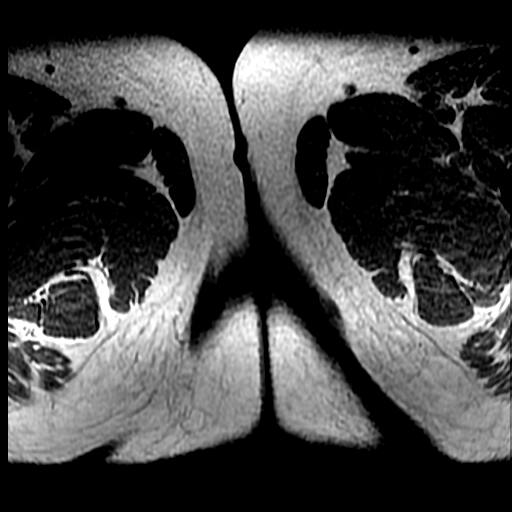
[im 23/46]
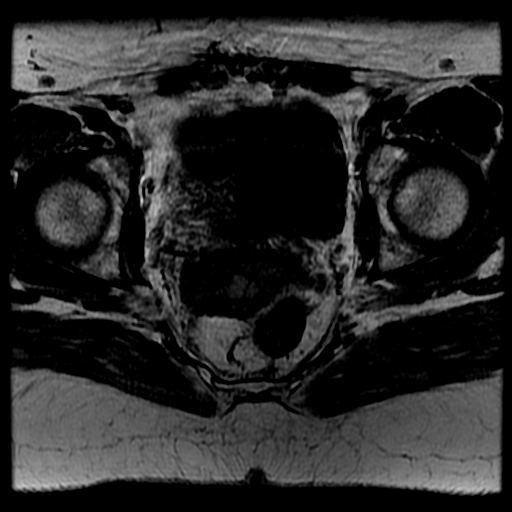
[im 46/46]
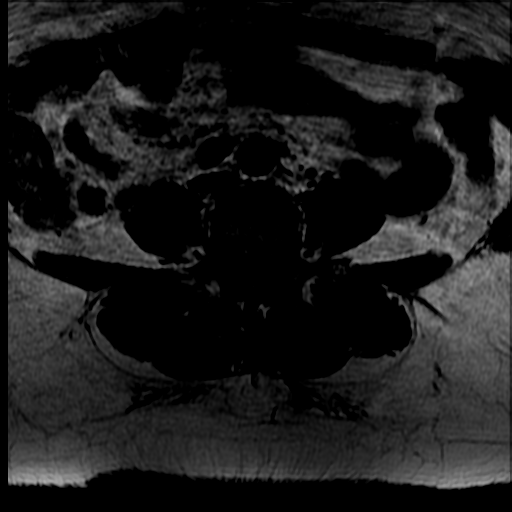

[Series 11: T2 post-contrast · sagittal · 5.0mm · 0.47mm/px · 2 of 26 slices shown]
[im 1/26]
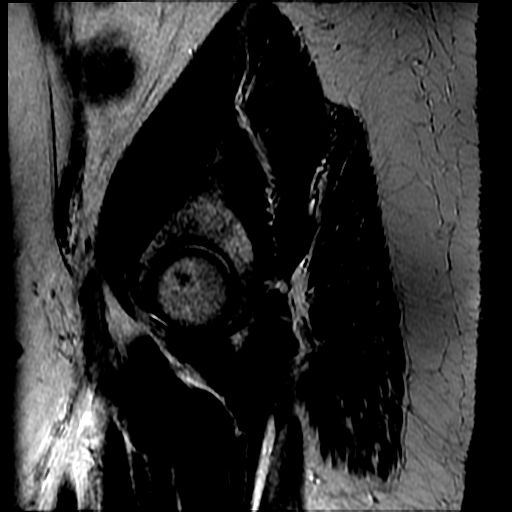
[im 26/26]
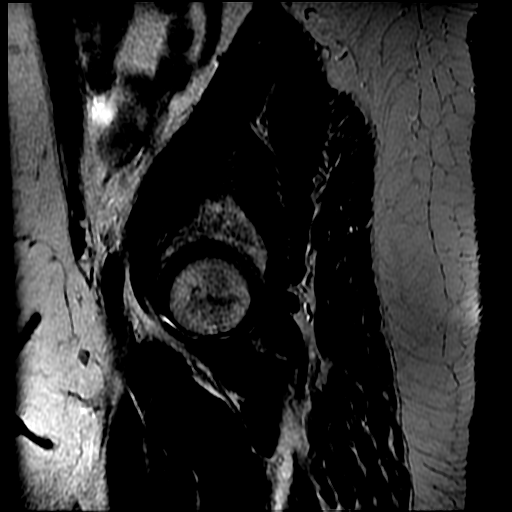

[18 of 48 positions shown; findings below may reference images not displayed]

FINDINGS: Urinary Tract: Normal bladder. Normal urethra. Subcentimeter simple
appearing lower left renal cyst. Otherwise normal limited views of
the kidneys with no hydronephrosis.

Bowel: Visualized small and large bowel are normal caliber with no
bowel wall thickening.

Vascular/Lymphatic: No pathologically enlarged lymph nodes in the
pelvis. No acute vascular abnormality.

Reproductive:

Uterus: The anteverted retroflexed enlarged myomatous uterus
measures 13.8 x 7.4 x 8.8 cm (volume = 470 cm^3). There are
several (at least 8) variably enhancing uterine fibroids, with
representative fibroids as follows:

-right anterior uterine body intramural 3.2 x 2.7 x 3.0 cm (volume =
14 cm^3) fibroid

-left lateral anterior uterine body intramural 3.2 x 3.2 x 2.7 cm
(volume = 14 cm^3) fibroid

-left anterior fundal subserosal 1.6 x 1.5 x 1.3 cm (volume =
cm^3) fibroid

-left posterior fundal submucosal 1.4 x 1.4 x 1.5 cm (volume =
cm^3) fibroid

No intracavitary or pedunculated fibroids. Inner myometrium
(junctional zone) measures 10 mm in thickness, which is within
normal limits. Endometrium measures 5 mm in bilayer thickness, which
is within normal limits. No endometrial cavity fluid or focal
endometrial mass. Small nabothian cysts in the uterine cervix,
largest 1.2 cm. Otherwise normal uterine cervix.

Ovaries and Adnexa: The right ovary measures 5.6 x 3.1 x 3.5 cm and
is normal with small simple follicular cysts. The left ovary is not
discretely visualized. There are no suspicious ovarian or adnexal
masses.

Other: No abnormal free fluid in the pelvis. No focal pelvic fluid
collection.

Musculoskeletal: No aggressive appearing focal osseous lesions.
IMPRESSION: 1. Enlarged anteverted retroflexed myomatous uterus as detailed. No
intracavitary or pedunculated fibroids. No MRI evidence of uterine
adenomyosis.
2. No adnexal masses. Normal right ovary. Nonvisualization of the
left ovary.

## 2021-01-06 DIAGNOSIS — E559 Vitamin D deficiency, unspecified: Secondary | ICD-10-CM | POA: Diagnosis not present

## 2021-01-06 DIAGNOSIS — D500 Iron deficiency anemia secondary to blood loss (chronic): Secondary | ICD-10-CM | POA: Diagnosis not present

## 2021-01-06 DIAGNOSIS — Z23 Encounter for immunization: Secondary | ICD-10-CM | POA: Diagnosis not present

## 2021-01-06 DIAGNOSIS — R252 Cramp and spasm: Secondary | ICD-10-CM | POA: Diagnosis not present

## 2021-01-06 DIAGNOSIS — L299 Pruritus, unspecified: Secondary | ICD-10-CM | POA: Diagnosis not present

## 2021-03-03 DIAGNOSIS — J22 Unspecified acute lower respiratory infection: Secondary | ICD-10-CM | POA: Diagnosis not present

## 2021-03-03 DIAGNOSIS — R0989 Other specified symptoms and signs involving the circulatory and respiratory systems: Secondary | ICD-10-CM | POA: Diagnosis not present

## 2021-03-03 DIAGNOSIS — R059 Cough, unspecified: Secondary | ICD-10-CM | POA: Diagnosis not present

## 2021-05-24 DIAGNOSIS — Z1331 Encounter for screening for depression: Secondary | ICD-10-CM | POA: Diagnosis not present

## 2021-05-24 DIAGNOSIS — D649 Anemia, unspecified: Secondary | ICD-10-CM | POA: Diagnosis not present

## 2021-05-24 DIAGNOSIS — R635 Abnormal weight gain: Secondary | ICD-10-CM | POA: Diagnosis not present

## 2021-05-24 DIAGNOSIS — N951 Menopausal and female climacteric states: Secondary | ICD-10-CM | POA: Diagnosis not present

## 2021-05-24 DIAGNOSIS — Z6841 Body Mass Index (BMI) 40.0 and over, adult: Secondary | ICD-10-CM | POA: Diagnosis not present

## 2021-05-24 DIAGNOSIS — I10 Essential (primary) hypertension: Secondary | ICD-10-CM | POA: Diagnosis not present

## 2021-05-24 DIAGNOSIS — Z1339 Encounter for screening examination for other mental health and behavioral disorders: Secondary | ICD-10-CM | POA: Diagnosis not present

## 2021-05-24 DIAGNOSIS — E559 Vitamin D deficiency, unspecified: Secondary | ICD-10-CM | POA: Diagnosis not present

## 2021-06-02 DIAGNOSIS — N951 Menopausal and female climacteric states: Secondary | ICD-10-CM | POA: Diagnosis not present

## 2021-06-02 DIAGNOSIS — R232 Flushing: Secondary | ICD-10-CM | POA: Diagnosis not present

## 2021-06-02 DIAGNOSIS — G479 Sleep disorder, unspecified: Secondary | ICD-10-CM | POA: Diagnosis not present

## 2021-06-18 DIAGNOSIS — E559 Vitamin D deficiency, unspecified: Secondary | ICD-10-CM | POA: Diagnosis not present

## 2021-06-18 DIAGNOSIS — Z6841 Body Mass Index (BMI) 40.0 and over, adult: Secondary | ICD-10-CM | POA: Diagnosis not present

## 2021-07-16 DIAGNOSIS — E559 Vitamin D deficiency, unspecified: Secondary | ICD-10-CM | POA: Diagnosis not present

## 2021-07-16 DIAGNOSIS — Z6841 Body Mass Index (BMI) 40.0 and over, adult: Secondary | ICD-10-CM | POA: Diagnosis not present

## 2021-09-30 DIAGNOSIS — Z01419 Encounter for gynecological examination (general) (routine) without abnormal findings: Secondary | ICD-10-CM | POA: Diagnosis not present

## 2021-10-07 DIAGNOSIS — R109 Unspecified abdominal pain: Secondary | ICD-10-CM | POA: Diagnosis not present

## 2021-10-07 DIAGNOSIS — N920 Excessive and frequent menstruation with regular cycle: Secondary | ICD-10-CM | POA: Diagnosis not present

## 2021-10-14 DIAGNOSIS — E559 Vitamin D deficiency, unspecified: Secondary | ICD-10-CM | POA: Diagnosis not present

## 2021-10-14 DIAGNOSIS — I10 Essential (primary) hypertension: Secondary | ICD-10-CM | POA: Diagnosis not present

## 2021-10-14 DIAGNOSIS — Z6841 Body Mass Index (BMI) 40.0 and over, adult: Secondary | ICD-10-CM | POA: Diagnosis not present

## 2021-10-20 DIAGNOSIS — E038 Other specified hypothyroidism: Secondary | ICD-10-CM | POA: Diagnosis not present

## 2021-10-20 DIAGNOSIS — Z1331 Encounter for screening for depression: Secondary | ICD-10-CM | POA: Diagnosis not present

## 2021-10-20 DIAGNOSIS — Z131 Encounter for screening for diabetes mellitus: Secondary | ICD-10-CM | POA: Diagnosis not present

## 2021-10-20 DIAGNOSIS — Z9189 Other specified personal risk factors, not elsewhere classified: Secondary | ICD-10-CM | POA: Diagnosis not present

## 2021-10-20 DIAGNOSIS — R5383 Other fatigue: Secondary | ICD-10-CM | POA: Diagnosis not present

## 2021-10-20 DIAGNOSIS — E8889 Other specified metabolic disorders: Secondary | ICD-10-CM | POA: Diagnosis not present

## 2021-10-20 DIAGNOSIS — I10 Essential (primary) hypertension: Secondary | ICD-10-CM | POA: Diagnosis not present

## 2021-10-20 DIAGNOSIS — E559 Vitamin D deficiency, unspecified: Secondary | ICD-10-CM | POA: Diagnosis not present

## 2021-10-20 DIAGNOSIS — E039 Hypothyroidism, unspecified: Secondary | ICD-10-CM | POA: Diagnosis not present

## 2021-10-20 DIAGNOSIS — D500 Iron deficiency anemia secondary to blood loss (chronic): Secondary | ICD-10-CM | POA: Diagnosis not present

## 2021-10-20 DIAGNOSIS — E282 Polycystic ovarian syndrome: Secondary | ICD-10-CM | POA: Diagnosis not present

## 2021-10-20 DIAGNOSIS — Z6841 Body Mass Index (BMI) 40.0 and over, adult: Secondary | ICD-10-CM | POA: Diagnosis not present

## 2021-11-08 DIAGNOSIS — R7303 Prediabetes: Secondary | ICD-10-CM | POA: Diagnosis not present

## 2021-11-08 DIAGNOSIS — I10 Essential (primary) hypertension: Secondary | ICD-10-CM | POA: Diagnosis not present

## 2021-11-08 DIAGNOSIS — E039 Hypothyroidism, unspecified: Secondary | ICD-10-CM | POA: Diagnosis not present

## 2021-11-08 DIAGNOSIS — Z9189 Other specified personal risk factors, not elsewhere classified: Secondary | ICD-10-CM | POA: Diagnosis not present

## 2021-11-08 DIAGNOSIS — E559 Vitamin D deficiency, unspecified: Secondary | ICD-10-CM | POA: Diagnosis not present

## 2021-11-18 ENCOUNTER — Other Ambulatory Visit: Payer: Self-pay | Admitting: Family Medicine

## 2021-11-18 DIAGNOSIS — Z1231 Encounter for screening mammogram for malignant neoplasm of breast: Secondary | ICD-10-CM

## 2021-11-25 DIAGNOSIS — G43909 Migraine, unspecified, not intractable, without status migrainosus: Secondary | ICD-10-CM | POA: Diagnosis not present

## 2021-11-25 DIAGNOSIS — R7303 Prediabetes: Secondary | ICD-10-CM | POA: Diagnosis not present

## 2021-11-25 DIAGNOSIS — Z9189 Other specified personal risk factors, not elsewhere classified: Secondary | ICD-10-CM | POA: Diagnosis not present

## 2021-11-25 DIAGNOSIS — K900 Celiac disease: Secondary | ICD-10-CM | POA: Diagnosis not present

## 2021-11-25 DIAGNOSIS — I10 Essential (primary) hypertension: Secondary | ICD-10-CM | POA: Diagnosis not present

## 2021-11-26 ENCOUNTER — Ambulatory Visit
Admission: RE | Admit: 2021-11-26 | Discharge: 2021-11-26 | Disposition: A | Discharge status: Home / Self Care | Payer: BC Managed Care – PPO | Source: Ambulatory Visit | Attending: Family Medicine | Admitting: Family Medicine

## 2021-11-26 DIAGNOSIS — Z1231 Encounter for screening mammogram for malignant neoplasm of breast: Secondary | ICD-10-CM

## 2021-12-16 DIAGNOSIS — N951 Menopausal and female climacteric states: Secondary | ICD-10-CM | POA: Diagnosis not present

## 2022-01-03 DIAGNOSIS — R7303 Prediabetes: Secondary | ICD-10-CM | POA: Diagnosis not present

## 2022-01-03 DIAGNOSIS — K900 Celiac disease: Secondary | ICD-10-CM | POA: Diagnosis not present

## 2022-01-03 DIAGNOSIS — I10 Essential (primary) hypertension: Secondary | ICD-10-CM | POA: Diagnosis not present

## 2022-01-03 DIAGNOSIS — N951 Menopausal and female climacteric states: Secondary | ICD-10-CM | POA: Diagnosis not present

## 2022-01-31 DIAGNOSIS — N951 Menopausal and female climacteric states: Secondary | ICD-10-CM | POA: Diagnosis not present

## 2022-01-31 DIAGNOSIS — I10 Essential (primary) hypertension: Secondary | ICD-10-CM | POA: Diagnosis not present

## 2022-01-31 DIAGNOSIS — R7303 Prediabetes: Secondary | ICD-10-CM | POA: Diagnosis not present

## 2022-01-31 DIAGNOSIS — M5136 Other intervertebral disc degeneration, lumbar region: Secondary | ICD-10-CM | POA: Diagnosis not present

## 2022-03-17 DIAGNOSIS — R7303 Prediabetes: Secondary | ICD-10-CM | POA: Diagnosis not present

## 2022-03-17 DIAGNOSIS — N951 Menopausal and female climacteric states: Secondary | ICD-10-CM | POA: Diagnosis not present

## 2022-03-17 DIAGNOSIS — Z6836 Body mass index (BMI) 36.0-36.9, adult: Secondary | ICD-10-CM | POA: Diagnosis not present

## 2022-04-11 DIAGNOSIS — R7303 Prediabetes: Secondary | ICD-10-CM | POA: Diagnosis not present

## 2022-05-02 DIAGNOSIS — R7303 Prediabetes: Secondary | ICD-10-CM | POA: Diagnosis not present

## 2022-05-26 DIAGNOSIS — E669 Obesity, unspecified: Secondary | ICD-10-CM | POA: Diagnosis not present

## 2022-05-26 DIAGNOSIS — R7303 Prediabetes: Secondary | ICD-10-CM | POA: Diagnosis not present

## 2022-05-26 DIAGNOSIS — N951 Menopausal and female climacteric states: Secondary | ICD-10-CM | POA: Diagnosis not present

## 2022-05-27 DIAGNOSIS — K130 Diseases of lips: Secondary | ICD-10-CM | POA: Diagnosis not present

## 2022-06-21 DIAGNOSIS — R7303 Prediabetes: Secondary | ICD-10-CM | POA: Diagnosis not present

## 2022-06-22 ENCOUNTER — Other Ambulatory Visit (HOSPITAL_COMMUNITY): Payer: Self-pay

## 2022-06-22 MED ORDER — ZEPBOUND 2.5 MG/0.5ML ~~LOC~~ SOAJ
2.5000 mg | SUBCUTANEOUS | 0 refills | Status: AC
  Filled 2022-06-22: qty 2, 28d supply, fill #0

## 2022-06-27 ENCOUNTER — Other Ambulatory Visit (HOSPITAL_COMMUNITY): Payer: Self-pay

## 2022-07-12 DIAGNOSIS — R7303 Prediabetes: Secondary | ICD-10-CM | POA: Diagnosis not present

## 2022-07-12 DIAGNOSIS — E669 Obesity, unspecified: Secondary | ICD-10-CM | POA: Diagnosis not present

## 2022-07-12 DIAGNOSIS — Z9189 Other specified personal risk factors, not elsewhere classified: Secondary | ICD-10-CM | POA: Diagnosis not present

## 2022-08-02 DIAGNOSIS — R7303 Prediabetes: Secondary | ICD-10-CM | POA: Diagnosis not present

## 2022-08-23 DIAGNOSIS — R7303 Prediabetes: Secondary | ICD-10-CM | POA: Diagnosis not present

## 2022-09-13 DIAGNOSIS — R7303 Prediabetes: Secondary | ICD-10-CM | POA: Diagnosis not present

## 2022-10-25 DIAGNOSIS — Z01419 Encounter for gynecological examination (general) (routine) without abnormal findings: Secondary | ICD-10-CM | POA: Diagnosis not present

## 2022-10-26 DIAGNOSIS — R7303 Prediabetes: Secondary | ICD-10-CM | POA: Diagnosis not present

## 2022-11-03 DIAGNOSIS — Z23 Encounter for immunization: Secondary | ICD-10-CM | POA: Diagnosis not present

## 2022-11-03 DIAGNOSIS — I10 Essential (primary) hypertension: Secondary | ICD-10-CM | POA: Diagnosis not present

## 2022-11-03 DIAGNOSIS — D500 Iron deficiency anemia secondary to blood loss (chronic): Secondary | ICD-10-CM | POA: Diagnosis not present

## 2022-11-03 DIAGNOSIS — E039 Hypothyroidism, unspecified: Secondary | ICD-10-CM | POA: Diagnosis not present

## 2022-11-03 DIAGNOSIS — E559 Vitamin D deficiency, unspecified: Secondary | ICD-10-CM | POA: Diagnosis not present

## 2022-11-03 DIAGNOSIS — R7303 Prediabetes: Secondary | ICD-10-CM | POA: Diagnosis not present

## 2022-11-22 DIAGNOSIS — N951 Menopausal and female climacteric states: Secondary | ICD-10-CM | POA: Diagnosis not present

## 2022-11-22 DIAGNOSIS — R7303 Prediabetes: Secondary | ICD-10-CM | POA: Diagnosis not present

## 2022-11-22 DIAGNOSIS — E669 Obesity, unspecified: Secondary | ICD-10-CM | POA: Diagnosis not present

## 2022-12-27 DIAGNOSIS — R7303 Prediabetes: Secondary | ICD-10-CM | POA: Diagnosis not present

## 2022-12-27 DIAGNOSIS — N951 Menopausal and female climacteric states: Secondary | ICD-10-CM | POA: Diagnosis not present

## 2023-01-25 DIAGNOSIS — N951 Menopausal and female climacteric states: Secondary | ICD-10-CM | POA: Diagnosis not present

## 2023-01-25 DIAGNOSIS — R232 Flushing: Secondary | ICD-10-CM | POA: Diagnosis not present

## 2023-01-25 DIAGNOSIS — Z79890 Hormone replacement therapy (postmenopausal): Secondary | ICD-10-CM | POA: Diagnosis not present

## 2023-01-25 DIAGNOSIS — G4700 Insomnia, unspecified: Secondary | ICD-10-CM | POA: Diagnosis not present

## 2023-01-27 ENCOUNTER — Other Ambulatory Visit: Payer: Self-pay | Admitting: Family Medicine

## 2023-01-27 DIAGNOSIS — Z1231 Encounter for screening mammogram for malignant neoplasm of breast: Secondary | ICD-10-CM

## 2023-02-28 DIAGNOSIS — R7303 Prediabetes: Secondary | ICD-10-CM | POA: Diagnosis not present

## 2023-02-28 DIAGNOSIS — N951 Menopausal and female climacteric states: Secondary | ICD-10-CM | POA: Diagnosis not present

## 2023-03-03 ENCOUNTER — Ambulatory Visit: Payer: BC Managed Care – PPO

## 2023-03-09 ENCOUNTER — Ambulatory Visit: Payer: BC Managed Care – PPO

## 2023-03-23 ENCOUNTER — Ambulatory Visit
Admission: RE | Admit: 2023-03-23 | Discharge: 2023-03-23 | Disposition: A | Discharge status: Home / Self Care | Payer: BC Managed Care – PPO | Source: Ambulatory Visit | Attending: Family Medicine | Admitting: Family Medicine

## 2023-03-23 DIAGNOSIS — Z1231 Encounter for screening mammogram for malignant neoplasm of breast: Secondary | ICD-10-CM | POA: Diagnosis not present

## 2023-03-28 DIAGNOSIS — N951 Menopausal and female climacteric states: Secondary | ICD-10-CM | POA: Diagnosis not present

## 2023-03-28 DIAGNOSIS — I10 Essential (primary) hypertension: Secondary | ICD-10-CM | POA: Diagnosis not present

## 2023-03-28 DIAGNOSIS — R7303 Prediabetes: Secondary | ICD-10-CM | POA: Diagnosis not present

## 2023-05-02 DIAGNOSIS — R7303 Prediabetes: Secondary | ICD-10-CM | POA: Diagnosis not present

## 2023-05-02 DIAGNOSIS — N951 Menopausal and female climacteric states: Secondary | ICD-10-CM | POA: Diagnosis not present

## 2023-05-02 DIAGNOSIS — E663 Overweight: Secondary | ICD-10-CM | POA: Diagnosis not present

## 2023-06-01 DIAGNOSIS — Z8639 Personal history of other endocrine, nutritional and metabolic disease: Secondary | ICD-10-CM | POA: Diagnosis not present

## 2023-06-01 DIAGNOSIS — R7303 Prediabetes: Secondary | ICD-10-CM | POA: Diagnosis not present

## 2023-06-01 DIAGNOSIS — N951 Menopausal and female climacteric states: Secondary | ICD-10-CM | POA: Diagnosis not present

## 2023-06-01 DIAGNOSIS — E663 Overweight: Secondary | ICD-10-CM | POA: Diagnosis not present

## 2023-07-23 DIAGNOSIS — X58XXXA Exposure to other specified factors, initial encounter: Secondary | ICD-10-CM | POA: Diagnosis not present

## 2023-07-23 DIAGNOSIS — M79672 Pain in left foot: Secondary | ICD-10-CM | POA: Diagnosis not present

## 2023-07-23 DIAGNOSIS — S92515A Nondisplaced fracture of proximal phalanx of left lesser toe(s), initial encounter for closed fracture: Secondary | ICD-10-CM | POA: Diagnosis not present

## 2023-10-22 DIAGNOSIS — R0982 Postnasal drip: Secondary | ICD-10-CM | POA: Diagnosis not present

## 2023-10-22 DIAGNOSIS — J029 Acute pharyngitis, unspecified: Secondary | ICD-10-CM | POA: Diagnosis not present

## 2023-11-07 DIAGNOSIS — D500 Iron deficiency anemia secondary to blood loss (chronic): Secondary | ICD-10-CM | POA: Diagnosis not present

## 2023-11-07 DIAGNOSIS — Z23 Encounter for immunization: Secondary | ICD-10-CM | POA: Diagnosis not present

## 2023-11-07 DIAGNOSIS — N951 Menopausal and female climacteric states: Secondary | ICD-10-CM | POA: Diagnosis not present

## 2023-11-07 DIAGNOSIS — E559 Vitamin D deficiency, unspecified: Secondary | ICD-10-CM | POA: Diagnosis not present

## 2023-11-07 DIAGNOSIS — R7303 Prediabetes: Secondary | ICD-10-CM | POA: Diagnosis not present

## 2023-11-07 DIAGNOSIS — I10 Essential (primary) hypertension: Secondary | ICD-10-CM | POA: Diagnosis not present

## 2023-12-27 DIAGNOSIS — Z8639 Personal history of other endocrine, nutritional and metabolic disease: Secondary | ICD-10-CM | POA: Diagnosis not present

## 2023-12-27 DIAGNOSIS — N951 Menopausal and female climacteric states: Secondary | ICD-10-CM | POA: Diagnosis not present

## 2023-12-27 DIAGNOSIS — E663 Overweight: Secondary | ICD-10-CM | POA: Diagnosis not present

## 2023-12-27 DIAGNOSIS — R7303 Prediabetes: Secondary | ICD-10-CM | POA: Diagnosis not present

## 2024-01-10 DIAGNOSIS — N951 Menopausal and female climacteric states: Secondary | ICD-10-CM | POA: Diagnosis not present

## 2024-01-10 DIAGNOSIS — E663 Overweight: Secondary | ICD-10-CM | POA: Diagnosis not present

## 2024-01-10 DIAGNOSIS — R7303 Prediabetes: Secondary | ICD-10-CM | POA: Diagnosis not present

## 2024-01-10 DIAGNOSIS — Z8639 Personal history of other endocrine, nutritional and metabolic disease: Secondary | ICD-10-CM | POA: Diagnosis not present
